# Patient Record
Sex: Male | Born: 1960 | Race: White | Hispanic: No | Marital: Married | Smoking: Never smoker
Health system: Southern US, Community
[De-identification: ages and names within clinical notes are randomized; demographics above are authoritative.]

## PROBLEM LIST (undated history)

## (undated) DIAGNOSIS — I10 Essential (primary) hypertension: Secondary | ICD-10-CM

## (undated) DIAGNOSIS — M549 Dorsalgia, unspecified: Secondary | ICD-10-CM

## (undated) HISTORY — PX: BACK SURGERY: SHX140

---

## 1999-03-12 ENCOUNTER — Emergency Department (HOSPITAL_COMMUNITY): Admission: EM | Admit: 1999-03-12 | Discharge: 1999-03-12 | Payer: Self-pay | Admitting: Emergency Medicine

## 2000-10-22 ENCOUNTER — Encounter: Admission: RE | Admit: 2000-10-22 | Discharge: 2000-10-31 | Payer: Self-pay | Admitting: Specialist

## 2002-06-26 ENCOUNTER — Ambulatory Visit (HOSPITAL_BASED_OUTPATIENT_CLINIC_OR_DEPARTMENT_OTHER): Admission: RE | Admit: 2002-06-26 | Discharge: 2002-06-26 | Payer: Self-pay | Admitting: Surgery

## 2002-06-26 ENCOUNTER — Encounter (INDEPENDENT_AMBULATORY_CARE_PROVIDER_SITE_OTHER): Payer: Self-pay

## 2006-03-28 ENCOUNTER — Encounter: Admission: RE | Admit: 2006-03-28 | Discharge: 2006-03-28 | Payer: Self-pay | Admitting: Orthopedic Surgery

## 2007-10-15 ENCOUNTER — Emergency Department (HOSPITAL_COMMUNITY): Admission: EM | Admit: 2007-10-15 | Discharge: 2007-10-16 | Payer: Self-pay | Admitting: Emergency Medicine

## 2008-03-17 ENCOUNTER — Inpatient Hospital Stay (HOSPITAL_COMMUNITY): Admission: RE | Admit: 2008-03-17 | Discharge: 2008-03-20 | Payer: Self-pay | Admitting: Orthopedic Surgery

## 2008-03-18 ENCOUNTER — Encounter (INDEPENDENT_AMBULATORY_CARE_PROVIDER_SITE_OTHER): Payer: Self-pay | Admitting: Orthopedic Surgery

## 2008-03-18 ENCOUNTER — Ambulatory Visit: Payer: Self-pay | Admitting: Surgery

## 2008-12-30 ENCOUNTER — Inpatient Hospital Stay (HOSPITAL_COMMUNITY): Admission: RE | Admit: 2008-12-30 | Discharge: 2009-01-02 | Payer: Self-pay | Admitting: Orthopedic Surgery

## 2010-10-29 ENCOUNTER — Encounter: Payer: Self-pay | Admitting: Family Medicine

## 2011-01-18 LAB — BASIC METABOLIC PANEL
CO2: 29 mEq/L (ref 19–32)
Calcium: 9.5 mg/dL (ref 8.4–10.5)
Chloride: 102 mEq/L (ref 96–112)
Chloride: 103 mEq/L (ref 96–112)
Creatinine, Ser: 0.94 mg/dL (ref 0.4–1.5)
Creatinine, Ser: 0.97 mg/dL (ref 0.4–1.5)
Creatinine, Ser: 0.99 mg/dL (ref 0.4–1.5)
Creatinine, Ser: 1.06 mg/dL (ref 0.4–1.5)
GFR calc Af Amer: >60 mL/min (ref >60)
GFR calc Af Amer: >60 mL/min (ref >60)
GFR calc non Af Amer: >60 mL/min (ref >60)
Glucose, Bld: 110 mg/dL — ABNORMAL HIGH (ref 70–99)
Glucose, Bld: 96 mg/dL (ref 70–99)
Potassium: 4.3 mEq/L (ref 3.5–5.1)
Potassium: 4.6 mEq/L (ref 3.5–5.1)
Sodium: 138 mEq/L (ref 135–145)

## 2011-01-18 LAB — CBC
HCT: 32 % — ABNORMAL LOW (ref 39.0–52.0)
HCT: 36.3 % — ABNORMAL LOW (ref 39.0–52.0)
HCT: 45.8 % (ref 39.0–52.0)
Hemoglobin: 11.1 g/dL — ABNORMAL LOW (ref 13.0–17.0)
Hemoglobin: 12 g/dL — ABNORMAL LOW (ref 13.0–17.0)
Hemoglobin: 12.6 g/dL — ABNORMAL LOW (ref 13.0–17.0)
MCHC: 34.7 g/dL (ref 30.0–36.0)
MCV: 89 fL (ref 78.0–100.0)
MCV: 89 fL (ref 78.0–100.0)
Platelets: 243 10*3/uL (ref 150–400)
RBC: 3.6 MIL/uL — ABNORMAL LOW (ref 4.22–5.81)
RBC: 4.08 MIL/uL — ABNORMAL LOW (ref 4.22–5.81)
RBC: 5.14 MIL/uL (ref 4.22–5.81)
RDW: 13.4 % (ref 11.5–15.5)
RDW: 13.6 % (ref 11.5–15.5)
WBC: 14.7 10*3/uL — ABNORMAL HIGH (ref 4.0–10.5)

## 2011-01-18 LAB — TYPE AND SCREEN

## 2011-02-20 NOTE — Op Note (Signed)
NAMEROBERTO, ROMANOSKI                 ACCOUNT NO.:  [id number]   MEDICAL RECORD NO.:  [id number]          PATIENT TYPE:  INP   LOCATION:  5029                         FACILITY:  [facility]   PHYSICIAN:  Alvy Beal, MD    DATE OF BIRTH:  Feb 26, 1961   DATE OF PROCEDURE:  DATE OF DISCHARGE:                               OPERATIVE REPORT   PREOPERATIVE DIAGNOSIS:  Degenerative spondylolisthesis, grade 1 at L5-  S1.   POSTOPERATIVE DIAGNOSIS:  Degenerative spondylolisthesis, grade 1 at L5-  S1.   OPERATIVE PROCEDURE:  Anterior lumbar interbody fusion.   COMPLICATIONS:  None.   INSTRUMENTATION SYSTEM USED:  Zimmer STALIF device with 30-mm screw  fixation at L5 and a 25-mm screw fixation at S1, anterior approach.   SURGEON:  Balinda Quails, MD   FIRST ASSISTANT:  Crissie Reese, PA   HISTORY:  This is a very pleasant 50 year old gentleman who presented to  my office with longstanding horrific back pain secondary to a work-  related injury.  Attempts at conservative management consisting of  physiotherapy, injection therapy, and narcotic pain medications had  failed to alleviate his symptoms.  Because of the collapse of L5-S1 disk  and the instability and the failure of conservative management, the  patient elected to proceed with surgery.  All appropriate risks,  benefits, and alternatives of the surgery were discussed with the  patient and consent was obtained.   OPERATIVE NOTE:  The patient was brought to the operating room and  placed supine on the operating table.  After successful induction of  general anesthesia and endotracheal intubation; TEDs, SCDs, and Foley  were applied.  The anterior abdomen was prepped and draped and a log  roll was placed underneath the pelvis to accentuate the extension and to  reduce the slip.   At this point, Dr. Madilyn Fireman scrubbed into the case and performed a standard  anterior approach to the lumbar spine.  Please refer to his dictation  for  specifics on the approach.   A needle was placed at the L5-S1 disk space and we confirmed using  intraoperative AP and lateral fluoroscopy that we were at the L5-S1 disk  space.  Once confirmed, I then scrubbed into the case.   Using a 10-blade scalpel, I incised the annulus of L5-S1 and then using  a combination of pituitary rongeurs, curettes, and Kerrison rongeurs; I  resected the entire disk material.  I then used a lamina spreader to  open the interbody space and then I used fine curettes to release the  annulus from the posterior aspect of the S1 vertebral body and the L5  vertebral body.  At this point with the annulus released, I was able to  get a 30-mm long-handled Kerrison behind the body of L5 and resect the  posterior osteophytes that had developed.  I did a similar procedure  behind the body of S1.  At this point with the diskectomy complete and  the release complete, I confirmed with live fluoro that I had parallel  distraction of the endplates.  Once confirmed, I then  measured the  interbody space.  I was able to get a 17-mm high 12-degree lordotic  STALIF cage to fit appropriately.  The 15-mm high 8-degree lordotic did  have a good fit, but it was not as secure as the 17.  When I measured  his other levels, he had 18-mm disk height at L4-L5, so I elected to  proceed with the 17, in no point that I see fish mouthing of the  interbody space, noted overdistraction at the facet joint and neural  foramen.  At this point with an adequate fit, I obtained the implantable  cage, packed it with Actifuse, and then malleted to appropriate depth.  I then secured it with the 6.5 screws.  I obtained excellent purchase  and maintained my reduction.  I then took final AP and lateral x-rays,  which demonstrated satisfactory hardware position and screw position.  At this point, I then packed the remaining Actifuse along the anterior  aspect of the cage.  With the interbody space, with the  fusion, and  instrumentation complete; I then removed the retractors and irrigated  copiously with normal saline.  There was no advertent bleeding noted.  At this point, I then closed the fascia of the rectus with running #1  Vicryl suture.  I then closed the deep fascia with interrupted 2-0  Vicryl suture in a 2-layer fashion and then the skin with 3-0 Monocryl.  Steri-Strips and dry dressings were applied.  The patient was extubated  and transferred to PACU without incident.  At the end of the case, all  needle and sponge counts were correct and intraoperative digital AP x-  ray after the fascial closure was done demonstrated no retained surgical  devices other than the implant and the 2 screws.  The patient was  transferred to the PACU without incident.  At the end of case, blood  loss was approximately 150 mL, and there were no complications.      Alvy Beal, MD  Electronically Signed     DDB/MEDQ  D:  03/17/2008  T:  03/18/2008  Job:  [id number]   cc:   Balinda Quails, M.D.

## 2011-02-20 NOTE — Op Note (Signed)
[name], [name]                 ACCOUNT NO.:  [id number]   MEDICAL RECORD NO.:  [id number]          PATIENT TYPE:  OIB   LOCATION:  2550                         FACILITY:  [facility]   PHYSICIAN:  Alvy Beal, MD    DATE OF BIRTH:  01-07-1961   DATE OF PROCEDURE:  12/30/2008  DATE OF DISCHARGE:                               OPERATIVE REPORT   SURGEON:  Dahari D. Shon Baton, MD   FIRST ASSISTANT:  Crissie Reese, PA   PREOPERATIVE DIAGNOSIS:  Lumbar final facet pain and L5-S1  pseudoarthrosis.   POSTOPERATIVE DIAGNOSIS:  Lumbar final facet pain and L5-S1  pseudoarthrosis.   OPERATIVE PROCEDURE:  1. Extensive L5-S1 decompression and foraminotomy.  2. Posterior instrumented fusion and arthrodesis at L5-S1.   INSTRUMENTATION USED:  Stryker radius pedicle screw system with a single  cross-link.   COMPLICATIONS:  None.   CONDITION:  Stable.   HISTORY:  This is a very pleasant 50 year old gentleman who  approximately 1 year ago underwent an anterior lumbar interbody fusion  at L5-S1 for degenerative disk disease.  The patient did well initially  but over the time started having increasing back pain.  Most of his pain  was extension related pain.  He did get relief with a facet block.  Subsequent CT evaluation demonstrated a pseudoarthrosis and ongoing  motion at the L5-S1 level.  Because of the nonunion, we elected to  proceed with a posterior supplemental instrumented fusion.  I also  elected to do a decompression to ensure that there was no neuropathic  compression.   OPERATIVE NOTE:  The patient was brought to the operating room and  placed supine on the operating table.  After successful induction of  general anesthesia and endotracheal intubation, TEDs, SCDs, and Foley  were inserted.  The patient was turned prone on the [place] table on the  spine frame.  The arms were properly positioned overhead.  All bony  prominences were well-padded and the back was prepped and  draped in  standard fashion.  A generous posterior midline lumbar incision was made  centered over the L5-S1 disk space.  Sharp dissection was carried out  down to the deep fascia.  Deep fascia was sharply incised to expose from  the inferior aspect of L4 to the  inferior aspect of S1.  These were  spinous processes.   I then used a periosteal Cobb elevators to strip the paraspinal muscles  off the L5-S1 spinous process and lamina and exposed the L4-L5 and L5-S1  facet capsules.  Once I had bilateral exposure, I then continued out  over the lateral aspect.  I dissected on the lateral side of the L4-L5  facet taking care not to disrupt or destroy the L4-L5 facet.  I then  identified the transverse process of L5.  I then disrupted the L5-S1  facet complex decorticating it and stripping out over the sacral ala.  I  did this bilaterally.  At this point in time, I then started with my  decompression.  I then removed the entire S1 spinous process and did a  complete S1 laminectomy.  I then did a small partial L5 laminotomy with  adequate exposure from the superior aspect of the L5 pedicle to the  inferior aspect of the S1 pedicle.  I then carried my dissection out to  the lateral gutter identifying the L5 and S1 nerve roots and performing  a generous L5 foraminotomy and S1 foraminotomy.  I also made sure that  the S1 nerve root was well decompressed in the lateral recess between L5-  S1.  I visualized the anterior cage that was in the disk space and it  was not prominent.  There were some posterior bone fragments that had  pushed posteriorly that I was able to resect.  At this point with the  decompression completed, I proceeded with the instrumentation.   Having direct visualization of the medial border of the pedicle, I was  able to anatomically determine the appropriate starting position for the  L5 pedicle and I confirmed it with lateral fluoroscopy.  I advanced the  trocar through the  pedicle and again checked with fluoroscopy as well as  directly visualizing the medial and inferior border of the L5 pedicle to  ensure that I had not disrupted it.  I then removed the probe, palpated,  tapped, and then checked the pedicle again to ensure there was no  disruption of the pedicle.  I then placed the 45 mm x 6.75 diameter  screws without complication.  I repeated the same procedure on the  contralateral side.  Again since I had direct visualization of the  pedicle, I could confirm that there was no medial or inferior breakout  of the pedicle screw.  At S1, on the right-hand side, using a same  technique of direct palpation and fluoroscopy, I placed the pedicle  screw.  This was a 7.75 x 40 mm length screw.  On the left-hand side,  during the insertion, I did slightly broach the medial wall of the  pedicle but I was able to remove the pedicle probe, reposition, and  place the screw down the S1 pedicle.  At this time, there was no  disruption of the pedicle although there was evidence of the previous  disruption.  I adequately decompressed and removed all of the bone  fragment and ensured that the S1 nerve root was not impinged upon or  directly compressed by the pedicle screw.   At this point in time with the fixation complete, I decorticated the  lateral posterolateral compresses and packed the local bone mixed with  Actifuse in the posterolateral gutter.  I then took two 50-mm rods,  secured them to the pedicle screws, and compressed the construct.  I  then placed a single cross-link for added stability, irrigated the wound  copiously multiple times with normal saline, and placed thrombin-soaked  Gelfoam patty over the entire exposed thecal sac and then placed a deep  drain.  I closed the wound in a layered fashion with interrupted #1  Vicryl sutures, 2-0 Vicryl sutures, and 3-0 Monocryl and I was able to  suture the drain down with a nylon stitch.  The patient was  extubated,  transferred to PACU without incident.  At the end of the case, all  needle and sponge counts were correct.      Alvy Beal, MD  Electronically Signed     DDB/MEDQ  D:  12/30/2008  T:  12/31/2008  Job:  [id number]

## 2011-02-20 NOTE — Op Note (Signed)
NAMEMarland Kitchen  [name] [name], [name] [name] NO.:  [id number]   MEDICAL RECORD NO.:  [id number]          PATIENT TYPE:  INP   LOCATION:  5029                         FACILITY:  [facility]   PHYSICIAN:  Balinda Quails, M.D.    DATE OF BIRTH:  04-Sep-1961   DATE OF PROCEDURE:  DATE OF DISCHARGE:                               OPERATIVE REPORT   SURGEON:  Balinda Quails, M.D.   CLOSE SURGEON:  Alvy Beal, MD.   ANESTHETIC:  General endotracheal.   PREOPERATIVE DIAGNOSIS:  L5-S1 degenerative disk disease.   POSTOPERATIVE DIAGNOSIS:  L5-S1 degenerative disk disease.   PROCEDURE:  L5-S1 anterior lumbar interbody fusion (ALIF).   CLINICAL NOTE:  This 50 year old gentleman is scheduled to undergo L5-S1  ALIF.  He was seen preoperatively and evaluated in the holding area.  No  contraindications to surgery.  Normal lower extremity pulses.  Risks and  benefits of the operative procedure were reviewed with the patient  including but not limited to limb ischemia, DVT, pulmonary embolus,  bleeding, transfusion, ureter injury, sexual dysfunction, hernia, and  infection.   OPERATIVE PROCEDURE:  The patient was brought to the operating room in  stable condition.  Placed under general endotracheal anesthesia.  Foley  catheter arterial line and central venous catheter in place.  Pulse  oximetry placed on the left foot.   The abdomen was prepped and draped in a sterile fashion.  A transverse  skin incision was made in the left lower quadrant at premarked site at  projection of L5-S1.  Subcutaneous tissue divided.  Left anterior rectus  sheath divided from midline to lateral margin.  The rectus muscle  mobilized medially.  The retroperitoneal space entered and the  peritoneal contents rotated anteriorly.  The left common and external  iliac artery were identified.  The psoas muscle and genitofemoral nerve  identified and the nerve preserved on the muscle.   The L5-S1 disk could be easily palpated.   Blunt dissection carried  directly down on the disk.  Middle sacral vessels were ligated.  There  were controlled with Bovie or bipolar cautery and divided.  The L5-S1  disk was cleared from left-to-right with blunt dissection.  The predisk  tissues were scored with Bovie cautery.   At completion of exposure, a Thompson-Brau retractor was assembled and  the reverse lip blades were hooked on the lateral margins of L5-S1  bilaterally.  This allowed full exposure of the disk and malleable  retractors were placed superiorly and inferiorly.   The L5-S1 ALIF was then carried out by Dr. Shon Baton.  Further details of  closure dictated also by Dr. Shon Baton.   The patient had stable pulse oximetry in the left foot throughout the  operative procedure and intact dorsalis pedis pulses bilaterally in the  PACU.      Balinda Quails, M.D.  Electronically Signed     PGH/MEDQ  D:  03/17/2008  T:  03/18/2008  Job:  [id number]

## 2011-02-23 NOTE — Discharge Summary (Signed)
NAMEROBBI, [name] [name]                 ACCOUNT NO.:  [id number]   MEDICAL RECORD NO.:  [id number]          PATIENT TYPE:  OIB   LOCATION:  5004                         FACILITY:  [facility]   PHYSICIAN:  Alvy Beal, MD    DATE OF BIRTH:  [birth year]   DATE OF ADMISSION:  12/30/2008  DATE OF DISCHARGE:  01/02/2009                               DISCHARGE SUMMARY   ADMISSION DIAGNOSIS:  Lumbar facet pain at L5-S1 with pseudoarthrosis at  L5-S1.   DISCHARGE DIAGNOSIS:  Lumbar facet pain at L5-S1 with pseudoarthrosis at  L5-S1.   OPERATIVE PROCEDURE:  Done on December 30, 2008 was:  1. Extensive L5-S1 decompression and foraminotomy.  2. Posterior instrumented fusion and arthrodesis at L5-S1.   CONSULTATIONS:  None.   BRIEF HISTORY:  [name] [name] is a very pleasant 50 year old gentleman who  approximately 1 year ago underwent an anterior lumbar interbody fusion  at L5-S1 for degenerative disk disease with Dr. Shon Baton.  The patient did  well initially but over time he started to have increasing back pain.  Most of his pain was extension related and he did get a relief with  facet block.  Subsequent CT evaluation demonstrated a pseudoarthrosis  and ongoing motion at L5-S1 level.  Because of the nonunion, the patient  elected to proceed with a posterior supplemental instrumented fusion.  Dr. Shon Baton also elected to do a decompression to ensure that there is no  neuropathic compression.   HOSPITAL COURSE:  The patient's hospital course was approximately 3 days  in length.  The patient tolerated the procedure very well and was  transferred from the PACU to the orthopedic floor without incident.  Postoperative day #1, the patient worked well with Physical Therapy.  Radiographs revealed stable placement of the hardware.  He was beginning  to tolerate a regular diet.  He was voiding on his own.  He was having  positive flatus.  Postoperatively on day #2, the patient continued to  improve.  His drain was  pulled and he continued to work well with  Physical Therapy.  Postoperatively on day #3, the patient continued to  make progress with Physical Therapy.  He was now having regular bowel  movements and working well with Physical Therapy.  His labs remained  stable throughout his hospital stay.  Therefore, the patient was deemed  stable to be discharged home with [name].   DISCHARGE LABORATORY DATA:  WBC of 12.9, RBC of 3.9, hemoglobin of 12.0  and a hematocrit of 34.7.  Sodium of 138, potassium of 4.3, chloride of  103, bicarb of 29, glucose of 92, BUN of 19 and creatinine of 1.06.   VITALS AT DISCHARGE:  Temperature of 97.6, pulse of 82 and his blood  pressure was 124/78.   DISPOSITION:  The patient is being discharged to home with [name] [name].   DISCHARGE MEDICATIONS:  The patient's discharge medications include:  1. Lisinopril 10 mg daily.  2. He is being discharged home on Robaxin 500 mg 1 tablet p.o. q.8 h.      p.r.n.  pain.  3. Norco 10/325 one tablet p.o. q.6 h. p.r.n. pain.  4. The patient is instructed to stop his hydrocodone 5/325.  5. Stop his cyclobenzaprine at 10 mg.   DISCHARGE INSTRUCTIONS:  The patient is instructed to avoid any bending,  stooping, twisting, and/or squatting.  He cannot walk as much as  tolerated.  He is to avoid any other rigorous activity.  He is not to  drive for 4-6 weeks.  The patient is to avoid lifting anything over 8  pounds.  The patient is allowed to shower postoperatively on day #5.  He  is to change the dressing daily x7 days and he is instructed to call our  office and schedule his followup appointment in 2 weeks.  The patient is  instructed to call the office for any increased fever, pain, redness  around the incision site and/or new onset of radicular leg pain, any  loss of bowel or bladder function.  Again, the patient is  instructed to  schedule his followup appointment at our office in 2 weeks and at  that  point, we will do a wound check.      Crissie Reese, PA      Alvy Beal, MD  Electronically Signed    AC/MEDQ  D:  02/11/2009  T:  02/12/2009  Job:  [id number]

## 2011-02-23 NOTE — Discharge Summary (Signed)
[name] [name], [name] [name]                 ACCOUNT NO.:  [id number]   MEDICAL RECORD NO.:  [id number]          PATIENT TYPE:  INP   LOCATION:  5029                         FACILITY:  [facility]   PHYSICIAN:  Alvy Beal, MD    DATE OF BIRTH:  12-May-1961   DATE OF ADMISSION:  03/17/2008  DATE OF DISCHARGE:  03/20/2008                               DISCHARGE SUMMARY   ADMITTING DIAGNOSIS:  Lumbar degenerative disk disease with grade 1  spondylolisthesis, L5-S1.   DISCHARGE DIAGNOSIS:  Lumbar degenerative disk disease with grade 1  spondylolisthesis, L5-S1.   Operative procedure done on date of admission was an anterior lumbar  interbody fusion, L5-S1 without complication.   HISTORY:  [name] is a very pleasant 50 year old gentleman with chronic  severe debilitating low back pain.  Attempts at conservative management  had failed to alleviate his symptoms.  We tried pain medical management,  injection therapy, physical therapy, and narcotic medications.  With  ongoing debilitating pain, I discussed treatment options with him and he  elected to proceed with surgery.  All appropriate risks, benefits, and  alternatives to surgery were discussed and consent was obtained.   On the date of admission, the patient was brought to the operating room  and had an anterior lumbar interbody fusion with fixation.  This was  uneventful with no significant adverse intraoperative complications.   On postoperative day #1, the patient had a lower extremity venous  Doppler, which showed no evidence of DVT.  He was seen and evaluated by  physical therapy and occupational therapy and was noted to be stable.   The patient's incision was clean, dry, and intact.  His constipation  resolved and he had a bowel movement on postoperative day #2.  He was  seen by Dr. Madilyn Fireman and was noted to be stable.   The patient was cleared by physical therapy and then later discharged on  March 20, 2008.  He was sent home with  appropriate pain medications,  muscle relaxers, and restarted his home medications.  The patient was  neurovascularly intact.  Postoperatively, a CT scan was done, which  demonstrated satisfactorily placement of the hardware, reduction of the  L5-S1 slip.      Alvy Beal, MD  Electronically Signed     DDB/MEDQ  D:  08/04/2008  T:  08/05/2008  Job:  [id number]

## 2011-06-28 LAB — CBC
HCT: 42.2
Hemoglobin: 14.1
MCHC: 33.4
MCV: 87.4
Platelets: 264
RBC: 4.83
RDW: 13.3
WBC: 10.8 — ABNORMAL HIGH

## 2011-06-28 LAB — DIFFERENTIAL
Basophils Absolute: 0
Basophils Relative: 0
Eosinophils Absolute: 0.2
Eosinophils Relative: 2
Lymphocytes Relative: 15
Lymphs Abs: 1.7
Monocytes Absolute: 1
Monocytes Relative: 9
Neutro Abs: 7.9 — ABNORMAL HIGH
Neutrophils Relative %: 74

## 2011-06-28 LAB — BASIC METABOLIC PANEL
BUN: 16
Chloride: 106
GFR calc Af Amer: >60
GFR calc non Af Amer: >60
Potassium: 3.8
Sodium: 138

## 2011-07-05 LAB — CBC
HCT: 45.1
Hemoglobin: 16
MCV: 88.4
RBC: 5.09
WBC: 9.2

## 2011-07-05 LAB — BASIC METABOLIC PANEL
CO2: 27
Chloride: 105
Creatinine, Ser: 0.98
GFR calc Af Amer: >60
Glucose, Bld: 103 — ABNORMAL HIGH

## 2011-07-05 LAB — TYPE AND SCREEN

## 2011-07-05 LAB — ABO/RH: ABO/RH(D): O NEG

## 2015-02-19 ENCOUNTER — Encounter (HOSPITAL_COMMUNITY): Payer: Self-pay | Admitting: Emergency Medicine

## 2015-02-19 ENCOUNTER — Emergency Department (HOSPITAL_COMMUNITY)
Admission: EM | Admit: 2015-02-19 | Discharge: 2015-02-19 | Disposition: A | Discharge status: Home / Self Care | Payer: Medicare Other | Attending: Emergency Medicine | Admitting: Emergency Medicine

## 2015-02-19 DIAGNOSIS — R05 Cough: Secondary | ICD-10-CM | POA: Diagnosis present

## 2015-02-19 DIAGNOSIS — J069 Acute upper respiratory infection, unspecified: Secondary | ICD-10-CM | POA: Diagnosis not present

## 2015-02-19 DIAGNOSIS — I10 Essential (primary) hypertension: Secondary | ICD-10-CM | POA: Insufficient documentation

## 2015-02-19 HISTORY — DX: Essential (primary) hypertension: I10

## 2015-02-19 HISTORY — DX: Dorsalgia, unspecified: M54.9

## 2015-02-19 NOTE — ED Notes (Signed)
Patient complaining of cough, body aches, fever, headache, one episode of diarrhea today.

## 2015-02-19 NOTE — Discharge Instructions (Signed)
Get plenty of rest and drink a lot of fluids. Take Tylenol or Motrin as needed for fever. See the doctor of your choice if not better in 2 or 3 days.   Upper Respiratory Infection, Adult An upper respiratory infection (URI) is also sometimes known as the common cold. The upper respiratory tract includes the nose, sinuses, throat, trachea, and bronchi. Bronchi are the airways leading to the lungs. Most people improve within 1 week, but symptoms can last up to 2 weeks. A residual cough may last even longer.  CAUSES Many different viruses can infect the tissues lining the upper respiratory tract. The tissues become irritated and inflamed and often become very moist. Mucus production is also common. A cold is contagious. You can easily spread the virus to others by oral contact. This includes kissing, sharing a glass, coughing, or sneezing. Touching your mouth or nose and then touching a surface, which is then touched by another person, can also spread the virus. SYMPTOMS  Symptoms typically develop 1 to 3 days after you come in contact with a cold virus. Symptoms vary from person to person. They may include:  Runny nose.  Sneezing.  Nasal congestion.  Sinus irritation.  Sore throat.  Loss of voice (laryngitis).  Cough.  Fatigue.  Muscle aches.  Loss of appetite.  Headache.  Low-grade fever. DIAGNOSIS  You might diagnose your own cold based on familiar symptoms, since most people get a cold 2 to 3 times a year. Your caregiver can confirm this based on your exam. Most importantly, your caregiver can check that your symptoms are not due to another disease such as strep throat, sinusitis, pneumonia, asthma, or epiglottitis. Blood tests, throat tests, and X-rays are not necessary to diagnose a common cold, but they may sometimes be helpful in excluding other more serious diseases. Your caregiver will decide if any further tests are required. RISKS AND COMPLICATIONS  You may be at risk  for a more severe case of the common cold if you smoke cigarettes, have chronic heart disease (such as heart failure) or lung disease (such as asthma), or if you have a weakened immune system. The very young and very old are also at risk for more serious infections. Bacterial sinusitis, middle ear infections, and bacterial pneumonia can complicate the common cold. The common cold can worsen asthma and chronic obstructive pulmonary disease (COPD). Sometimes, these complications can require emergency medical care and may be life-threatening. PREVENTION  The best way to protect against getting a cold is to practice good hygiene. Avoid oral or hand contact with people with cold symptoms. Wash your hands often if contact occurs. There is no clear evidence that vitamin C, vitamin E, echinacea, or exercise reduces the chance of developing a cold. However, it is always recommended to get plenty of rest and practice good nutrition. TREATMENT  Treatment is directed at relieving symptoms. There is no cure. Antibiotics are not effective, because the infection is caused by a virus, not by bacteria. Treatment may include:  Increased fluid intake. Sports drinks offer valuable electrolytes, sugars, and fluids.  Breathing heated mist or steam (vaporizer or shower).  Eating chicken soup or other clear broths, and maintaining good nutrition.  Getting plenty of rest.  Using gargles or lozenges for comfort.  Controlling fevers with ibuprofen or acetaminophen as directed by your caregiver.  Increasing usage of your inhaler if you have asthma. Zinc gel and zinc lozenges, taken in the first 24 hours of the common cold, can shorten  the duration and lessen the severity of symptoms. Pain medicines may help with fever, muscle aches, and throat pain. A variety of non-prescription medicines are available to treat congestion and runny nose. Your caregiver can make recommendations and may suggest nasal or lung inhalers for other  symptoms.  HOME CARE INSTRUCTIONS   Only take over-the-counter or prescription medicines for pain, discomfort, or fever as directed by your caregiver.  Use a warm mist humidifier or inhale steam from a shower to increase air moisture. This may keep secretions moist and make it easier to breathe.  Drink enough water and fluids to keep your urine clear or pale yellow.  Rest as needed.  Return to work when your temperature has returned to normal or as your caregiver advises. You may need to stay home longer to avoid infecting others. You can also use a face mask and careful hand washing to prevent spread of the virus. SEEK MEDICAL CARE IF:   After the first few days, you feel you are getting worse rather than better.  You need your caregiver's advice about medicines to control symptoms.  You develop chills, worsening shortness of breath, or brown or red sputum. These may be signs of pneumonia.  You develop yellow or brown nasal discharge or pain in the face, especially when you bend forward. These may be signs of sinusitis.  You develop a fever, swollen neck glands, pain with swallowing, or white areas in the back of your throat. These may be signs of strep throat. SEEK IMMEDIATE MEDICAL CARE IF:   You have a fever.  You develop severe or persistent headache, ear pain, sinus pain, or chest pain.  You develop wheezing, a prolonged cough, cough up blood, or have a change in your usual mucus (if you have chronic lung disease).  You develop sore muscles or a stiff neck. Document Released: 03/20/2001 Document Revised: 12/17/2011 Document Reviewed: 12/30/2013 [facility] Patient Information 2015 [employer] [place]. This information is not intended to replace advice given to you by your health care provider. Make sure you discuss any questions you have with your health care provider.

## 2015-02-19 NOTE — ED Provider Notes (Signed)
CSN: [id number]     Arrival date & time 02/19/15  2021 History   First MD Initiated Contact with Patient 02/19/15 2055     Chief Complaint  Patient presents with  . Cough     (Consider location/radiation/quality/duration/timing/severity/associated sxs/prior Treatment) HPI   Rosita KeaDonnie Peretz is a 54 y.o. male who presents for evaluation of cough, fever, body aches, and a single episode of diarrhea. He is using Tylenol with improvement of his fever. His cough is improving after taking Robitussin. He denies nausea, vomiting, weakness or dizziness. His son had a similar illness about 3 days ago. He has been taking his usual medications. There are no other known modifying factors.   Past Medical History  Diagnosis Date  . Hypertension   . Back pain    Past Surgical History  Procedure Laterality Date  . Back surgery     History reviewed. No pertinent family history. History  Substance Use Topics  . Smoking status: Never Smoker   . Smokeless tobacco: Not on file  . Alcohol Use: No    Review of Systems  All other systems reviewed and are negative.     Allergies  Tamiflu  Home Medications   Prior to Admission medications   Not on File   BP 122/74 mmHg  Pulse 92  Temp(Src) 100.4 F (38 C) (Oral)  Ht 5' 8.5" (1.74 m)  Wt 270 lb (122.471 kg)  BMI 40.45 kg/m2  SpO2 94% Physical Exam  Constitutional: He is oriented to person, place, and time. He appears well-developed and well-nourished.  HENT:  Head: Normocephalic and atraumatic.  Right Ear: External ear normal.  Left Ear: External ear normal.  No tenderness to percussion over the frontal or maxillary sinuses.  Eyes: Conjunctivae and EOM are normal. Pupils are equal, round, and reactive to light.  Neck: Normal range of motion and phonation normal. Neck supple.  Cardiovascular: Normal rate, regular rhythm and normal heart sounds.   Pulmonary/Chest: Effort normal and breath sounds normal. No respiratory distress. He has no  wheezes. He exhibits no tenderness and no bony tenderness.  Abdominal: Soft. There is no tenderness.  Musculoskeletal: Normal range of motion.  Neurological: He is alert and oriented to person, place, and time. No cranial nerve deficit or sensory deficit. He exhibits normal muscle tone. Coordination normal.  Skin: Skin is warm, dry and intact. No rash noted.  Psychiatric: He has a normal mood and affect. His behavior is normal. Judgment and thought content normal.  Nursing note and vitals reviewed.   ED Course  Procedures (including critical care time)  Nontoxic, patient with essentially normal neurologic exam. Findings discussed with patient. Questions answered  Labs Review Labs Reviewed - No data to display  Imaging Review No results found.   EKG Interpretation None      MDM   Final diagnoses:  URI (upper respiratory infection)    Evaluation consistent with URI. Doubt pneumonia, metabolic instability or serious bacterial infection.  Nursing Notes Reviewed/ Care Coordinated Applicable Imaging Reviewed Interpretation of Laboratory Data incorporated into ED treatment  The patient appears reasonably screened and/or stabilized for discharge and I doubt any other medical condition or other [facility] requiring further screening, evaluation, or treatment in the ED at this time prior to discharge.  Plan: Home Medications- OTC antipyretic, Robitussin DM for cough; Home Treatments- rest, fluids; return here if the recommended treatment, does not improve the symptoms; Recommended follow up- PCP prn     Mancel BaleElliott Sincere Liuzzi, MD 02/19/15 2109

## 2015-02-23 ENCOUNTER — Inpatient Hospital Stay (HOSPITAL_COMMUNITY)
Admission: EM | Admit: 2015-02-23 | Discharge: 2015-02-28 | DRG: 871 | Disposition: A | Discharge status: Home / Self Care | Payer: Medicare Other | Attending: Internal Medicine | Admitting: Internal Medicine

## 2015-02-23 ENCOUNTER — Encounter (HOSPITAL_COMMUNITY): Payer: Self-pay | Admitting: Emergency Medicine

## 2015-02-23 ENCOUNTER — Emergency Department (HOSPITAL_COMMUNITY): Payer: Medicare Other

## 2015-02-23 ENCOUNTER — Inpatient Hospital Stay (HOSPITAL_COMMUNITY): Payer: Medicare Other

## 2015-02-23 DIAGNOSIS — E876 Hypokalemia: Secondary | ICD-10-CM | POA: Diagnosis present

## 2015-02-23 DIAGNOSIS — Z791 Long term (current) use of non-steroidal anti-inflammatories (NSAID): Secondary | ICD-10-CM | POA: Diagnosis not present

## 2015-02-23 DIAGNOSIS — E860 Dehydration: Secondary | ICD-10-CM | POA: Diagnosis present

## 2015-02-23 DIAGNOSIS — J9601 Acute respiratory failure with hypoxia: Secondary | ICD-10-CM | POA: Diagnosis present

## 2015-02-23 DIAGNOSIS — I350 Nonrheumatic aortic (valve) stenosis: Secondary | ICD-10-CM | POA: Diagnosis not present

## 2015-02-23 DIAGNOSIS — Z79899 Other long term (current) drug therapy: Secondary | ICD-10-CM | POA: Diagnosis not present

## 2015-02-23 DIAGNOSIS — A419 Sepsis, unspecified organism: Secondary | ICD-10-CM | POA: Diagnosis present

## 2015-02-23 DIAGNOSIS — Z8249 Family history of ischemic heart disease and other diseases of the circulatory system: Secondary | ICD-10-CM

## 2015-02-23 DIAGNOSIS — Z888 Allergy status to other drugs, medicaments and biological substances status: Secondary | ICD-10-CM | POA: Diagnosis not present

## 2015-02-23 DIAGNOSIS — J9600 Acute respiratory failure, unspecified whether with hypoxia or hypercapnia: Secondary | ICD-10-CM | POA: Diagnosis present

## 2015-02-23 DIAGNOSIS — J189 Pneumonia, unspecified organism: Secondary | ICD-10-CM | POA: Diagnosis present

## 2015-02-23 DIAGNOSIS — I10 Essential (primary) hypertension: Secondary | ICD-10-CM | POA: Diagnosis present

## 2015-02-23 DIAGNOSIS — K760 Fatty (change of) liver, not elsewhere classified: Secondary | ICD-10-CM | POA: Diagnosis present

## 2015-02-23 DIAGNOSIS — R112 Nausea with vomiting, unspecified: Secondary | ICD-10-CM | POA: Diagnosis present

## 2015-02-23 DIAGNOSIS — K7200 Acute and subacute hepatic failure without coma: Secondary | ICD-10-CM | POA: Diagnosis present

## 2015-02-23 DIAGNOSIS — R7989 Other specified abnormal findings of blood chemistry: Secondary | ICD-10-CM | POA: Diagnosis not present

## 2015-02-23 DIAGNOSIS — E871 Hypo-osmolality and hyponatremia: Secondary | ICD-10-CM | POA: Diagnosis present

## 2015-02-23 DIAGNOSIS — R609 Edema, unspecified: Secondary | ICD-10-CM

## 2015-02-23 DIAGNOSIS — M79609 Pain in unspecified limb: Secondary | ICD-10-CM | POA: Diagnosis not present

## 2015-02-23 DIAGNOSIS — R945 Abnormal results of liver function studies: Secondary | ICD-10-CM

## 2015-02-23 LAB — CBC
HCT: 38.1 % — ABNORMAL LOW (ref 39.0–52.0)
Hemoglobin: 13.4 g/dL (ref 13.0–17.0)
MCH: 30 pg (ref 26.0–34.0)
MCHC: 35.2 g/dL (ref 30.0–36.0)
MCV: 85.2 fL (ref 78.0–100.0)
PLATELETS: 248 10*3/uL (ref 150–400)
RBC: 4.47 MIL/uL (ref 4.22–5.81)
RDW: 13.3 % (ref 11.5–15.5)
WBC: 19 10*3/uL — ABNORMAL HIGH (ref 4.0–10.5)

## 2015-02-23 LAB — COMPREHENSIVE METABOLIC PANEL
ALT: 82 U/L — ABNORMAL HIGH (ref 17–63)
ANION GAP: 14 (ref 5–15)
AST: 116 U/L — AB (ref 15–41)
Albumin: 3 g/dL — ABNORMAL LOW (ref 3.5–5.0)
Alkaline Phosphatase: 76 U/L (ref 38–126)
BILIRUBIN TOTAL: 1.3 mg/dL — AB (ref 0.3–1.2)
BUN: 14 mg/dL (ref 6–20)
CALCIUM: 8.8 mg/dL — AB (ref 8.9–10.3)
CO2: 24 mmol/L (ref 22–32)
CREATININE: 1.17 mg/dL (ref 0.61–1.24)
Chloride: 90 mmol/L — ABNORMAL LOW (ref 101–111)
GFR calc Af Amer: >60 mL/min (ref >60)
GLUCOSE: 128 mg/dL — AB (ref 65–99)
Potassium: 3.3 mmol/L — ABNORMAL LOW (ref 3.5–5.1)
Sodium: 128 mmol/L — ABNORMAL LOW (ref 135–145)
Total Protein: 7.7 g/dL (ref 6.5–8.1)

## 2015-02-23 LAB — CG4 I-STAT (LACTIC ACID): LACTIC ACID, VENOUS: 1.23 mmol/L (ref 0.5–2.0)

## 2015-02-23 LAB — CBC WITH DIFFERENTIAL/PLATELET
BASOS PCT: 0 % (ref 0–1)
Basophils Absolute: 0 10*3/uL (ref 0.0–0.1)
EOS ABS: 0.1 10*3/uL (ref 0.0–0.7)
EOS PCT: 0 % (ref 0–5)
HCT: 42.4 % (ref 39.0–52.0)
HEMOGLOBIN: 15.2 g/dL (ref 13.0–17.0)
LYMPHS ABS: 1.4 10*3/uL (ref 0.7–4.0)
LYMPHS PCT: 8 % — AB (ref 12–46)
MCH: 30.3 pg (ref 26.0–34.0)
MCHC: 35.8 g/dL (ref 30.0–36.0)
MCV: 84.6 fL (ref 78.0–100.0)
MONOS PCT: 6 % (ref 3–12)
Monocytes Absolute: 1.2 10*3/uL — ABNORMAL HIGH (ref 0.1–1.0)
NEUTROS ABS: 16.6 10*3/uL — AB (ref 1.7–7.7)
NEUTROS PCT: 86 % — AB (ref 43–77)
Platelets: 264 10*3/uL (ref 150–400)
RBC: 5.01 MIL/uL (ref 4.22–5.81)
RDW: 13.4 % (ref 11.5–15.5)
WBC: 19.3 10*3/uL — AB (ref 4.0–10.5)

## 2015-02-23 LAB — I-STAT CG4 LACTIC ACID, ED
LACTIC ACID, VENOUS: 1.74 mmol/L (ref 0.5–2.0)
Lactic Acid, Venous: 1.8 mmol/L (ref 0.5–2.0)

## 2015-02-23 LAB — BRAIN NATRIURETIC PEPTIDE: B Natriuretic Peptide: 47.9 pg/mL (ref 0.0–100.0)

## 2015-02-23 LAB — I-STAT TROPONIN, ED: Troponin i, poc: 0.02 ng/mL (ref 0.00–0.08)

## 2015-02-23 MED ORDER — ACETAMINOPHEN 325 MG PO TABS
650.0000 mg | ORAL_TABLET | Freq: Four times a day (QID) | ORAL | Status: DC | PRN
  Administered 2015-02-23: 650 mg via ORAL
  Filled 2015-02-23: qty 2

## 2015-02-23 MED ORDER — DEXTROSE 5 % IV SOLN
1.0000 g | Freq: Once | INTRAVENOUS | Status: AC
  Administered 2015-02-23: 1 g via INTRAVENOUS
  Filled 2015-02-23: qty 10

## 2015-02-23 MED ORDER — ONDANSETRON HCL 4 MG PO TABS: 4.0000 mg | ORAL_TABLET | Freq: Four times a day (QID) | ORAL | Status: DC | PRN

## 2015-02-23 MED ORDER — CEFTRIAXONE SODIUM IN DEXTROSE 20 MG/ML IV SOLN
1.0000 g | Freq: Once | INTRAVENOUS | Status: DC
  Filled 2015-02-23: qty 50

## 2015-02-23 MED ORDER — ONDANSETRON HCL 4 MG/2ML IJ SOLN
4.0000 mg | Freq: Four times a day (QID) | INTRAMUSCULAR | Status: DC | PRN
  Administered 2015-02-23: 4 mg via INTRAVENOUS
  Filled 2015-02-23: qty 2

## 2015-02-23 MED ORDER — DEXTROSE 5 % IV SOLN
500.0000 mg | Freq: Once | INTRAVENOUS | Status: DC
  Filled 2015-02-23: qty 500

## 2015-02-23 MED ORDER — POTASSIUM CHLORIDE CRYS ER 20 MEQ PO TBCR
40.0000 meq | EXTENDED_RELEASE_TABLET | Freq: Once | ORAL | Status: AC
  Administered 2015-02-23: 40 meq via ORAL
  Filled 2015-02-23: qty 2

## 2015-02-23 MED ORDER — DEXTROSE 5 % IV SOLN
1.0000 g | INTRAVENOUS | Status: DC
  Administered 2015-02-24 – 2015-02-26 (×3): 1 g via INTRAVENOUS
  Filled 2015-02-23 (×5): qty 10

## 2015-02-23 MED ORDER — BENZONATATE 100 MG PO CAPS
100.0000 mg | ORAL_CAPSULE | Freq: Three times a day (TID) | ORAL | Status: DC | PRN
  Administered 2015-02-24: 200 mg via ORAL
  Filled 2015-02-23 (×2): qty 2

## 2015-02-23 MED ORDER — FLUOXETINE HCL 20 MG PO CAPS
20.0000 mg | ORAL_CAPSULE | Freq: Every day | ORAL | Status: DC
  Administered 2015-02-24 – 2015-02-28 (×5): 20 mg via ORAL
  Filled 2015-02-23 (×5): qty 1

## 2015-02-23 MED ORDER — SODIUM CHLORIDE 0.9 % IV BOLUS (SEPSIS)
1000.0000 mL | INTRAVENOUS | Status: DC
  Administered 2015-02-23: 1000 mL via INTRAVENOUS

## 2015-02-23 MED ORDER — DEXTROSE 5 % IV SOLN
500.0000 mg | Freq: Once | INTRAVENOUS | Status: AC
  Administered 2015-02-23: 500 mg via INTRAVENOUS
  Filled 2015-02-23 (×2): qty 500

## 2015-02-23 MED ORDER — GUAIFENESIN ER 600 MG PO TB12
600.0000 mg | ORAL_TABLET | Freq: Two times a day (BID) | ORAL | Status: DC | PRN
  Administered 2015-02-24: 600 mg via ORAL
  Filled 2015-02-23 (×2): qty 1

## 2015-02-23 MED ORDER — ENOXAPARIN SODIUM 40 MG/0.4ML ~~LOC~~ SOLN
40.0000 mg | SUBCUTANEOUS | Status: DC
  Administered 2015-02-24 – 2015-02-27 (×5): 40 mg via SUBCUTANEOUS
  Filled 2015-02-23 (×6): qty 0.4

## 2015-02-23 MED ORDER — LISINOPRIL 10 MG PO TABS
10.0000 mg | ORAL_TABLET | Freq: Every day | ORAL | Status: DC
  Administered 2015-02-24 – 2015-02-28 (×5): 10 mg via ORAL
  Filled 2015-02-23 (×5): qty 1

## 2015-02-23 MED ORDER — IPRATROPIUM-ALBUTEROL 0.5-2.5 (3) MG/3ML IN SOLN
3.0000 mL | Freq: Once | RESPIRATORY_TRACT | Status: AC
  Administered 2015-02-23: 3 mL via RESPIRATORY_TRACT
  Filled 2015-02-23: qty 3

## 2015-02-23 MED ORDER — SODIUM CHLORIDE 0.9 % IV SOLN
INTRAVENOUS | Status: AC
  Administered 2015-02-23: 23:00:00 via INTRAVENOUS

## 2015-02-23 MED ORDER — LORAZEPAM 0.5 MG PO TABS
0.5000 mg | ORAL_TABLET | ORAL | Status: DC | PRN
  Administered 2015-02-25 – 2015-02-26 (×3): 0.5 mg via ORAL
  Filled 2015-02-23 (×3): qty 1

## 2015-02-23 MED ORDER — SIMVASTATIN 20 MG PO TABS
20.0000 mg | ORAL_TABLET | Freq: Every day | ORAL | Status: DC
  Administered 2015-02-24 – 2015-02-28 (×5): 20 mg via ORAL
  Filled 2015-02-23 (×5): qty 1

## 2015-02-23 MED ORDER — DEXTROSE 5 % IV SOLN: 250.0000 mg | INTRAVENOUS | Status: DC

## 2015-02-23 MED ORDER — ACETAMINOPHEN 650 MG RE SUPP: 650.0000 mg | Freq: Four times a day (QID) | RECTAL | Status: DC | PRN

## 2015-02-23 NOTE — ED Notes (Signed)
Pt saturations 86% on room air. Pt placed on 2.5L and o2 saturations 92%

## 2015-02-23 NOTE — ED Provider Notes (Addendum)
CSN: [id number]     Arrival date & time 02/23/15  1848 History   First MD Initiated Contact with Patient 02/23/15 1926     Chief Complaint  Patient presents with  . Shortness of Breath  . Fever     (Consider location/radiation/quality/duration/timing/severity/associated sxs/prior Treatment) HPI Complains of cough productive of yellow sputum onset 4 days ago. Symptoms are coming by fever. He denies any vomiting. No shortness of breath. No other associated symptoms.. Treated with amoxicillin starting 2 days ago without relief. Denies any calf pain. Seen by Dr.Burnette earlier today. Sent here for further evaluation. Dr. Doristine Counter was concerned about possible pulmonary embolism. Past Medical History  Diagnosis Date  . Hypertension   . Back pain    Past Surgical History  Procedure Laterality Date  . Back surgery     History reviewed. No pertinent family history. History  Substance Use Topics  . Smoking status: Never Smoker   . Smokeless tobacco: Not on file  . Alcohol Use: No   no illicit drug use  Review of Systems  Constitutional: Positive for fever.  HENT: Negative.   Respiratory: Positive for cough and shortness of breath.   Cardiovascular: Negative.   Gastrointestinal: Negative.   Musculoskeletal: Negative.   Skin: Negative.   Neurological: Negative.   Psychiatric/Behavioral: Negative.   All other systems reviewed and are negative.     Allergies  Tamiflu  Home Medications   Prior to Admission medications   Not on File   BP 126/70 mmHg  Pulse 88  Temp(Src) 101.8 F (38.8 C) (Oral)  Resp 26  SpO2 92% Physical Exam  Constitutional: He appears well-developed and well-nourished. No distress.  HENT:  Head: Normocephalic and atraumatic.  Eyes: Conjunctivae are normal. Pupils are equal, round, and reactive to light.  Neck: Neck supple. No tracheal deviation present. No thyromegaly present.  Cardiovascular: Normal rate and regular rhythm.   No murmur  heard. Pulmonary/Chest: Effort normal. No respiratory distress.  Coughing frequently  Abdominal: Soft. Bowel sounds are normal. He exhibits no distension. There is no tenderness.  Musculoskeletal: Normal range of motion. He exhibits no edema or tenderness.  Neurological: He is alert. Coordination normal.  Skin: Skin is warm and dry. No rash noted.  Psychiatric: He has a normal mood and affect.  Nursing note and vitals reviewed.   ED Course  Procedures (including critical care time) Labs Review Labs Reviewed  CBC WITH DIFFERENTIAL/PLATELET - Abnormal; Notable for the following:    WBC 19.3 (*)    Neutrophils Relative % 86 (*)    Neutro Abs 16.6 (*)    Lymphocytes Relative 8 (*)    Monocytes Absolute 1.2 (*)    All other components within normal limits  COMPREHENSIVE METABOLIC PANEL - Abnormal; Notable for the following:    Sodium 128 (*)    Potassium 3.3 (*)    Chloride 90 (*)    Glucose, Bld 128 (*)    Calcium 8.8 (*)    Albumin 3.0 (*)    AST 116 (*)    ALT 82 (*)    Total Bilirubin 1.3 (*)    All other components within normal limits  CULTURE, BLOOD (ROUTINE X 2)  CULTURE, BLOOD (ROUTINE X 2)  BRAIN NATRIURETIC PEPTIDE  I-STAT TROPOININ, ED  I-STAT CG4 LACTIC ACID, ED  I-STAT CG4 LACTIC ACID, ED    Imaging Review Dg Chest 2 View  02/23/2015   CLINICAL DATA:  Chest pain, shortness of breath, cough and fever since 02/20/2015.  EXAM: CHEST  2 VIEW  COMPARISON:  PA and lateral chest 12/24/2008.  FINDINGS: Extensive airspace disease is seen the left chest, worst in the lingula. The right lung appears clear. Heart size is normal. No pneumothorax or pleural effusion.  IMPRESSION: Extensive left side airspace disease most consistent with pneumonia. Recommend followup films to clearing.   Electronically Signed   By: Drusilla Kannerhomas  Dalessio M.D.   On: 02/23/2015 19:44     EKG Interpretation   Date/Time:  Wednesday Feb 23 2015 18:56:20 EDT Ventricular Rate:  90 PR Interval:   124 QRS Duration: 88 QT Interval:  360 QTC Calculation: 440 R Axis:   -39 Text Interpretation:  Normal sinus rhythm Left axis deviation Abnormal ECG  No significant change since last tracing Confirmed by Channon Brougher  MD, Jayten Gabbard  [phone]([id number]) on 02/23/2015 8:36:51 PM     chest x-ray viewed by me 835 pmPatient states breathing improved slightly after treatment with DuoNeb. Results for orders placed or performed during the hospital encounter of 02/23/15  BNP (order ONLY if patient complains of dyspnea/SOB AND you have documented it for THIS visit)  Result Value Ref Range   B Natriuretic Peptide 47.9 0.0 - 100.0 pg/mL  CBC with Differential  Result Value Ref Range   WBC 19.3 (H) 4.0 - 10.5 K/uL   RBC 5.01 4.22 - 5.81 MIL/uL   Hemoglobin 15.2 13.0 - 17.0 g/dL   HCT 78.442.4 69.639.0 - 29.552.0 %   MCV 84.6 78.0 - 100.0 fL   MCH 30.3 26.0 - 34.0 pg   MCHC 35.8 30.0 - 36.0 g/dL   RDW 28.413.4 13.211.5 - 44.015.5 %   Platelets 264 150 - 400 K/uL   Neutrophils Relative % 86 (H) 43 - 77 %   Neutro Abs 16.6 (H) 1.7 - 7.7 K/uL   Lymphocytes Relative 8 (L) 12 - 46 %   Lymphs Abs 1.4 0.7 - 4.0 K/uL   Monocytes Relative 6 3 - 12 %   Monocytes Absolute 1.2 (H) 0.1 - 1.0 K/uL   Eosinophils Relative 0 0 - 5 %   Eosinophils Absolute 0.1 0.0 - 0.7 K/uL   Basophils Relative 0 0 - 1 %   Basophils Absolute 0.0 0.0 - 0.1 K/uL  Comprehensive metabolic panel  Result Value Ref Range   Sodium 128 (L) 135 - 145 mmol/L   Potassium 3.3 (L) 3.5 - 5.1 mmol/L   Chloride 90 (L) 101 - 111 mmol/L   CO2 24 22 - 32 mmol/L   Glucose, Bld 128 (H) 65 - 99 mg/dL   BUN 14 6 - 20 mg/dL   Creatinine, Ser 1.021.17 0.61 - 1.24 mg/dL   Calcium 8.8 (L) 8.9 - 10.3 mg/dL   Total Protein 7.7 6.5 - 8.1 g/dL   Albumin 3.0 (L) 3.5 - 5.0 g/dL   AST 725116 (H) 15 - 41 U/L   ALT 82 (H) 17 - 63 U/L   Alkaline Phosphatase 76 38 - 126 U/L   Total Bilirubin 1.3 (H) 0.3 - 1.2 mg/dL   GFR calc non Af Amer >60 >60 mL/min   GFR calc Af Amer >60 >60 mL/min   Anion gap  14 5 - 15  I-stat troponin, ED  [facility])  Result Value Ref Range   Troponin i, poc 0.02 0.00 - 0.08 ng/mL   Comment 3          I-Stat CG4 Lactic Acid, ED  Result Value Ref Range   Lactic Acid, Venous 1.80 0.5 -  2.0 mmol/L   Dg Chest 2 View  02/23/2015   CLINICAL DATA:  Chest pain, shortness of breath, cough and fever since 02/20/2015.  EXAM: CHEST  2 VIEW  COMPARISON:  PA and lateral chest 12/24/2008.  FINDINGS: Extensive airspace disease is seen the left chest, worst in the lingula. The right lung appears clear. Heart size is normal. No pneumothorax or pleural effusion.  IMPRESSION: Extensive left side airspace disease most consistent with pneumonia. Recommend followup films to clearing.   Electronically Signed   By: Drusilla Kannerhomas  Dalessio M.D.   On: 02/23/2015 19:44    MDM  Code sepsis called based on fever, respiratory rate, leukocytosis, source is respiratory. Patient has oxygen requirement therefore will admit spoke with Dr.Kakrakandy plan admit step down unit, oxygen, intravenous antibiotics and intravenous fluids Final diagnoses:  None  Strongly doubt pe , based on cough, fever, leukocytosis cxr, no claf pain or tenderness. CRITICAL CARE Performed by: Doug SouJACUBOWITZ,Tahjir Silveria Total critical care time: 30 minute Critical care time was exclusive of separately billable procedures and treating other patients. Critical care was necessary to treat or prevent imminent or life-threatening deterioration. Critical care was time spent personally by me on the following activities: development of treatment plan with patient and/or surrogate as well as nursing, discussions with consultants, evaluation of patient's response to treatment, examination of patient, obtaining history from patient or surrogate, ordering and performing treatments and interventions, ordering and review of laboratory studies, ordering and review of radiographic studies, pulse oximetry and re-evaluation of patient's condition.  Dx  #1 CAP  #2 hypoxia #3 hyponatremia #4 hypokalemia    Doug SouSam Adelee Hannula, MD 02/23/15 2045  Doug SouSam Lorena Benham, MD 02/23/15 2048

## 2015-02-23 NOTE — H&P (Signed)
[facility] History and Physical  [name] KeaDonnie Stewart JKK:[id number] DOB: 1961-07-20 DOA: 02/23/2015  Referring physician: Dr. Narda AmberJacobowictz. PCP: Lilia ArgueKAPLAN,KRISTEN, PA-C  Specialists: None.  Chief Complaint: Shortness of breath.  HPI: [name] [name] is a 54 y.o. male history of hypertension has been having productive cough nausea vomiting with shortness of breath over the last 4-5 days. 2 days ago patient's primary care physician had prescribed amoxicillin for possible pneumonia despite taking which patient was still short of breath with productive cough. Patient states he has had multiple episodes of nausea and vomiting. Denies any abdominal pain or diarrhea. In the ER patient was found to be hypoxic with chest x-ray showing left-sided infiltrates and febrile. Patient has been admitted for acute respiratory failure with sepsis secondary to pneumonia. Patient denies any recent travel or sick contacts.   Review of Systems: As presented in the history of presenting illness, rest negative.  Past Medical History  Diagnosis Date  . Hypertension   . Back pain    Past Surgical History  Procedure Laterality Date  . Back surgery     Social History:  reports that he has never smoked. He does not have any smokeless tobacco history on file. He reports that he does not drink alcohol. His drug history is not on file. Where does patient live home. Can patient participate in ADLs? Yes.  Allergies  Allergen Reactions  . Tamiflu [Oseltamivir Phosphate] Itching and Other (See Comments)    Burning sensation and tingling per patient    Family History:  Family History  Problem Relation Age of Onset  . Hypertension Mother   . Diabetes Mellitus II Father   . CAD Father       Prior to Admission medications   Medication Sig Start Date End Date Taking? Authorizing Provider  acetaminophen (TYLENOL) 500 MG tablet Take 500 mg by mouth every 6 (six) hours as needed for fever.   Yes Historical Provider, MD   amoxicillin (AMOXIL) 875 MG tablet Take 875 mg by mouth every 12 (twelve) hours.   Yes Historical Provider, MD  benzonatate (TESSALON) 100 MG capsule Take 100-200 mg by mouth 3 (three) times daily as needed for cough.   Yes Historical Provider, MD  FLUoxetine (PROZAC) 20 MG capsule Take 20 mg by mouth daily.   Yes Historical Provider, MD  guaiFENesin (MUCINEX) 600 MG 12 hr tablet Take 600 mg by mouth 2 (two) times daily as needed for cough or to loosen phlegm.   Yes Historical Provider, MD  guaifenesin (ROBITUSSIN) 100 MG/5ML syrup Take 200 mg by mouth 3 (three) times daily as needed for cough.   Yes Historical Provider, MD  lisinopril (PRINIVIL,ZESTRIL) 10 MG tablet Take 10 mg by mouth daily.   Yes Historical Provider, MD  LORazepam (ATIVAN) 0.5 MG tablet Take 0.5 mg by mouth every 4 (four) hours as needed for anxiety.   Yes Historical Provider, MD  simvastatin (ZOCOR) 20 MG tablet Take 20 mg by mouth daily at 6 PM.   Yes Historical Provider, MD    Physical Exam: Filed Vitals:   02/23/15 2015 02/23/15 2030 02/23/15 2034 02/23/15 2045  BP: 126/70 122/59  119/88  Pulse: 88 84  85  Temp:      TempSrc:      Resp: 26 28  29   Height:   5\' 8"  (1.727 m)   Weight:   119.75 kg (264 lb)   SpO2: 92% 91%  93%     General:  Well-developed and nourished.  Eyes: Anicteric no  pallor.  ENT: No discharge from the ears eyes nose and mouth.  Neck: No mass felt.  Cardiovascular: S1 and S2 heard.  Respiratory: No rhonchi or crepitations.  Abdomen: Soft nontender bowel sounds present.  Skin: No rash.  Musculoskeletal: Mild edema of the right lower extremity.  Psychiatric: Appears normal.  Neurologic: Alert awake oriented to time place and person. Moves all extremities.  Labs on Admission:  Basic Metabolic Panel:  Recent Labs Lab 02/23/15 1902  NA 128*  K 3.3*  CL 90*  CO2 24  GLUCOSE 128*  BUN 14  CREATININE 1.17  CALCIUM 8.8*   Liver Function Tests:  Recent Labs Lab  02/23/15 1902  AST 116*  ALT 82*  ALKPHOS 76  BILITOT 1.3*  PROT 7.7  ALBUMIN 3.0*   No results for input(s): LIPASE, AMYLASE in the last 168 hours. No results for input(s): AMMONIA in the last 168 hours. CBC:  Recent Labs Lab 02/23/15 1902  WBC 19.3*  NEUTROABS 16.6*  HGB 15.2  HCT 42.4  MCV 84.6  PLT 264   Cardiac Enzymes: No results for input(s): CKTOTAL, CKMB, CKMBINDEX, TROPONINI in the last 168 hours.  BNP (last 3 results)  Recent Labs  02/23/15 1902  BNP 47.9    ProBNP (last 3 results) No results for input(s): PROBNP in the last 8760 hours.  CBG: No results for input(s): GLUCAP in the last 168 hours.  Radiological Exams on Admission: Dg Chest 2 View  02/23/2015   CLINICAL DATA:  Chest pain, shortness of breath, cough and fever since 02/20/2015.  EXAM: CHEST  2 VIEW  COMPARISON:  PA and lateral chest 12/24/2008.  FINDINGS: Extensive airspace disease is seen the left chest, worst in the lingula. The right lung appears clear. Heart size is normal. No pneumothorax or pleural effusion.  IMPRESSION: Extensive left side airspace disease most consistent with pneumonia. Recommend followup films to clearing.   Electronically Signed   By: Drusilla Kannerhomas  Dalessio M.D.   On: 02/23/2015 19:44    EKG: Independently reviewed. Normal sinus rhythm.  Assessment/Plan Principal Problem:   Acute respiratory failure with hypoxia Active Problems:   Community acquired pneumonia   Sepsis   Elevated LFTs   Nausea & vomiting   Hyponatremia   Hypertension   Acute respiratory failure   1. Acute hypoxic respiratory failure with sepsis from pneumonia - patient has been placed on ceftriaxone and Zithromax for pneumonia from community-acquired. Check blood cultures and influenza PCR HIV status urine for Legionella and strep antigen and follow lactic acid levels and for calcitonin levels. Continue with hydration. Since patient had some mild swelling in the right lower extremity check  Dopplers to rule out DVT. 2. Elevated LFTs may be from sepsis but since patient has nausea and vomiting we will check sonogram of the abdomen. At this time abdomen appears benign. Follow LFTs. Check acute hepatitis panel. Check Tylenol levels. 3. Hyponatremia and hypokalemia probably from nausea and vomiting - replace and recheck. Continue hydration. Follow metabolic panel closely. 4. Hypertension on lisinopril which will be continued.   Note that patient is allergic to Tamiflu.   DVT Prophylaxis Lovenox.  Code Status: Full code.  Family Communication: Patient's wife.  Disposition Plan: Admit to inpatient to stepdown. Likely stay 2-3 days.    Krystol Rocco N. [facility] Pager [phone].  If 7PM-7AM, please contact night-coverage [email or web address] Password [email or web address] 02/23/2015, 9:20 PM

## 2015-02-23 NOTE — ED Notes (Signed)
Patient transported to X-ray 

## 2015-02-23 NOTE — ED Notes (Signed)
Pt here for fever, right calf pain, SOB and N/V/D; pt noted to have low O2 sats

## 2015-02-23 NOTE — ED Notes (Signed)
Family at bedside. 

## 2015-02-23 NOTE — Progress Notes (Signed)
ANTIBIOTIC CONSULT NOTE - INITIAL  Pharmacy Consult for azithromycin and ceftriaxone Indication: pneumonia  Allergies  Allergen Reactions  . Tamiflu [Oseltamivir Phosphate] Itching and Other (See Comments)    Burning sensation and tingling per patient    Patient Measurements: Height: 5\' 8"  (172.7 cm) Weight: 264 lb (119.75 kg) IBW/kg (Calculated) : 68.4   Vital Signs: Temp: 101.5 F (38.6 C) (05/18 2128) Temp Source: Oral (05/18 2128) BP: 130/64 mmHg (05/18 2130) Pulse Rate: 84 (05/18 2130) Intake/Output from previous day:   Intake/Output from this shift:    Labs:  Recent Labs  02/23/15 1902  WBC 19.3*  HGB 15.2  PLT 264  CREATININE 1.17   Estimated Creatinine Clearance: 94 mL/min (by C-G formula based on Cr of 1.17). No results for input(s): VANCOTROUGH, VANCOPEAK, VANCORANDOM, GENTTROUGH, GENTPEAK, GENTRANDOM, TOBRATROUGH, TOBRAPEAK, TOBRARND, AMIKACINPEAK, AMIKACINTROU, AMIKACIN in the last 72 hours.   Microbiology: No results found for this or any previous visit (from the past 720 hour(s)).  Medical History: Past Medical History  Diagnosis Date  . Hypertension   . Back pain     Medications:  Prescriptions prior to admission  Medication Sig Dispense Refill Last Dose  . acetaminophen (TYLENOL) 500 MG tablet Take 500 mg by mouth every 6 (six) hours as needed for fever.   02/21/2015  . amoxicillin (AMOXIL) 875 MG tablet Take 875 mg by mouth every 12 (twelve) hours.   02/23/2015 at Unknown time  . benzonatate (TESSALON) 100 MG capsule Take 100-200 mg by mouth 3 (three) times daily as needed for cough.   02/23/2015 at Unknown time  . FLUoxetine (PROZAC) 20 MG capsule Take 20 mg by mouth daily.   2 days ago at Unknown time  . guaiFENesin (MUCINEX) 600 MG 12 hr tablet Take 600 mg by mouth 2 (two) times daily as needed for cough or to loosen phlegm.   02/19/2015  . guaifenesin (ROBITUSSIN) 100 MG/5ML syrup Take 200 mg by mouth 3 (three) times daily as needed for  cough.   02/21/2015  . lisinopril (PRINIVIL,ZESTRIL) 10 MG tablet Take 10 mg by mouth daily.   2 days ago at Unknown time  . LORazepam (ATIVAN) 0.5 MG tablet Take 0.5 mg by mouth every 4 (four) hours as needed for anxiety.   02/23/2015 at Unknown time  . simvastatin (ZOCOR) 20 MG tablet Take 20 mg by mouth daily at 6 PM.   2 days ago at Unknown time   Assessment: 54 yo to start antibiotics for PNA.  Goal of Therapy:  Eradication of infection  Plan:  Cont antibiotics as ordered, ceftriaxone 1 gm IV q24 hours and azithromycin 500 mg IV X 1 then 250 mg IV daily  Brock Larmon Poteet 02/23/2015,10:28 PM

## 2015-02-24 ENCOUNTER — Inpatient Hospital Stay (HOSPITAL_COMMUNITY): Payer: Medicare Other

## 2015-02-24 DIAGNOSIS — J9601 Acute respiratory failure with hypoxia: Secondary | ICD-10-CM

## 2015-02-24 DIAGNOSIS — I10 Essential (primary) hypertension: Secondary | ICD-10-CM

## 2015-02-24 DIAGNOSIS — E871 Hypo-osmolality and hyponatremia: Secondary | ICD-10-CM

## 2015-02-24 DIAGNOSIS — R112 Nausea with vomiting, unspecified: Secondary | ICD-10-CM

## 2015-02-24 DIAGNOSIS — R7989 Other specified abnormal findings of blood chemistry: Secondary | ICD-10-CM

## 2015-02-24 DIAGNOSIS — M79609 Pain in unspecified limb: Secondary | ICD-10-CM

## 2015-02-24 DIAGNOSIS — J189 Pneumonia, unspecified organism: Secondary | ICD-10-CM

## 2015-02-24 DIAGNOSIS — A419 Sepsis, unspecified organism: Principal | ICD-10-CM

## 2015-02-24 LAB — BASIC METABOLIC PANEL
Anion gap: 9 (ref 5–15)
BUN: 12 mg/dL (ref 6–20)
CALCIUM: 7.9 mg/dL — AB (ref 8.9–10.3)
CO2: 28 mmol/L (ref 22–32)
Chloride: 95 mmol/L — ABNORMAL LOW (ref 101–111)
Creatinine, Ser: 1.11 mg/dL (ref 0.61–1.24)
GFR calc Af Amer: >60 mL/min (ref >60)
GFR calc non Af Amer: >60 mL/min (ref >60)
GLUCOSE: 159 mg/dL — AB (ref 65–99)
Potassium: 3.6 mmol/L (ref 3.5–5.1)
Sodium: 132 mmol/L — ABNORMAL LOW (ref 135–145)

## 2015-02-24 LAB — HEPATIC FUNCTION PANEL
ALBUMIN: 2.5 g/dL — AB (ref 3.5–5.0)
ALK PHOS: 71 U/L (ref 38–126)
ALT: 81 U/L — ABNORMAL HIGH (ref 17–63)
AST: 105 U/L — ABNORMAL HIGH (ref 15–41)
BILIRUBIN TOTAL: 0.9 mg/dL (ref 0.3–1.2)
Bilirubin, Direct: 0.4 mg/dL (ref 0.1–0.5)
Indirect Bilirubin: 0.5 mg/dL (ref 0.3–0.9)
Total Protein: 6.7 g/dL (ref 6.5–8.1)

## 2015-02-24 LAB — PROCALCITONIN: PROCALCITONIN: 0.53 ng/mL

## 2015-02-24 LAB — HEPATITIS PANEL, ACUTE
HCV AB: NEGATIVE
HEP B C IGM: NONREACTIVE
HEP B S AG: NEGATIVE
Hep A IgM: NONREACTIVE

## 2015-02-24 LAB — CBC WITH DIFFERENTIAL/PLATELET
BASOS ABS: 0 10*3/uL (ref 0.0–0.1)
BASOS PCT: 0 % (ref 0–1)
EOS ABS: 0.1 10*3/uL (ref 0.0–0.7)
Eosinophils Relative: 0 % (ref 0–5)
HEMATOCRIT: 38.6 % — AB (ref 39.0–52.0)
HEMOGLOBIN: 13.4 g/dL (ref 13.0–17.0)
Lymphocytes Relative: 10 % — ABNORMAL LOW (ref 12–46)
Lymphs Abs: 1.8 10*3/uL (ref 0.7–4.0)
MCH: 29.8 pg (ref 26.0–34.0)
MCHC: 34.7 g/dL (ref 30.0–36.0)
MCV: 86 fL (ref 78.0–100.0)
Monocytes Absolute: 0.8 10*3/uL (ref 0.1–1.0)
Monocytes Relative: 4 % (ref 3–12)
NEUTROS ABS: 15.4 10*3/uL — AB (ref 1.7–7.7)
NEUTROS PCT: 86 % — AB (ref 43–77)
PLATELETS: 246 10*3/uL (ref 150–400)
RBC: 4.49 MIL/uL (ref 4.22–5.81)
RDW: 13.6 % (ref 11.5–15.5)
WBC: 18.1 10*3/uL — ABNORMAL HIGH (ref 4.0–10.5)

## 2015-02-24 LAB — CREATININE, SERUM
CREATININE: 1.22 mg/dL (ref 0.61–1.24)
GFR calc Af Amer: >60 mL/min (ref >60)
GFR calc non Af Amer: >60 mL/min (ref >60)

## 2015-02-24 LAB — HIV ANTIBODY (ROUTINE TESTING W REFLEX): HIV SCREEN 4TH GENERATION: NONREACTIVE

## 2015-02-24 LAB — ACETAMINOPHEN LEVEL: Acetaminophen (Tylenol), Serum: <10 ug/mL — ABNORMAL LOW (ref 10–30)

## 2015-02-24 LAB — PROTIME-INR
INR: 1.26 (ref 0.00–1.49)
Prothrombin Time: 15.9 seconds — ABNORMAL HIGH (ref 11.6–15.2)

## 2015-02-24 LAB — MAGNESIUM: MAGNESIUM: 2.1 mg/dL (ref 1.7–2.4)

## 2015-02-24 LAB — MRSA PCR SCREENING: MRSA by PCR: NEGATIVE

## 2015-02-24 LAB — INFLUENZA PANEL BY PCR (TYPE A & B)
H1N1 flu by pcr: NOT DETECTED
INFLBPCR: NEGATIVE
Influenza A By PCR: NEGATIVE

## 2015-02-24 LAB — APTT: aPTT: 36 seconds (ref 24–37)

## 2015-02-24 LAB — STREP PNEUMONIAE URINARY ANTIGEN: STREP PNEUMO URINARY ANTIGEN: NEGATIVE

## 2015-02-24 MED ORDER — METHYLPREDNISOLONE SODIUM SUCC 125 MG IJ SOLR
60.0000 mg | INTRAMUSCULAR | Status: DC
  Administered 2015-02-24 – 2015-02-26 (×2): 60 mg via INTRAVENOUS
  Filled 2015-02-24 (×2): qty 0.96
  Filled 2015-02-24: qty 2

## 2015-02-24 MED ORDER — DM-GUAIFENESIN ER 30-600 MG PO TB12
1.0000 | ORAL_TABLET | Freq: Two times a day (BID) | ORAL | Status: DC
  Administered 2015-02-24 – 2015-02-28 (×8): 1 via ORAL
  Filled 2015-02-24 (×11): qty 1

## 2015-02-24 MED ORDER — DEXTROSE 5 % IV SOLN
250.0000 mg | INTRAVENOUS | Status: DC
  Administered 2015-02-24 – 2015-02-25 (×2): 250 mg via INTRAVENOUS
  Filled 2015-02-24 (×4): qty 250

## 2015-02-24 NOTE — Progress Notes (Signed)
Right Lower Ext. Venous Duplex Completed. Negative for DVT or SVT in the right lower extremity. Marilynne Halstedita Assata Juncaj, BS, RDMS, RVT

## 2015-02-24 NOTE — Progress Notes (Signed)
[place] TEAM 1 - Stepdown/ICU TEAM Progress Note  [name] SalvageDonnie XXXAmos [name] DOB: 03-25-1961 DOA: 02/23/2015 PCP: Lilia ArgueKAPLAN,KRISTEN, PA-C  Admit HPI / Brief Narrative: [name] [name] is a 54 y.o.WM PMHx anxiety, hypertension, HLD  has been having productive cough nausea vomiting with shortness of breath over the last 4-5 days. 2 days ago patient's primary care physician had prescribed amoxicillin for possible pneumonia despite taking which patient was still short of breath with productive cough. Patient states he has had multiple episodes of nausea and vomiting. Denies any abdominal pain or diarrhea. In the ER patient was found to be hypoxic with chest x-ray showing left-sided infiltrates and febrile. Patient has been admitted for acute respiratory failure with sepsis secondary to pneumonia. Patient denies any recent travel or sick contacts.    HPI/Subjective: 5/19 A/O 4, negative N/V negative CP, positive SOB especially with exertion even while on O2. Patient states negative smoking history, negative use of home O2.   Assessment/Plan: Acute hypoxic respiratory failure with hypoxia/Sepsis from CAP  - Continue current antibiotics  -Solu-Medrol 60 mg daily  -Mucinex DM  BID -Sputum culture pending -patient has been placed on ceftriaxone and Zithromax for pneumonia from community-acquired. Check blood cultures and influenza PCR HIV status urine for Legionella and strep antigen and follow lactic acid levels and for calcitonin levels. Continue with hydration. Since patient had some mild swelling in the right lower extremity check Dopplers to rule out DVT.  Elevated LFTs  -Nominal ultrasound negative see results below -Will trend liver enzymes; shock liver? -Acute hepatitis panel negative -Tylenol level within normal limit  HTN -Continue lisinopril 10 mg daily  Hyponatremia -Most likely secondary to dehydration from nausea and vomiting -Continue normal saline 12600ml/hr  Hypokalemia    -Resolved, continue to monitor closely     Code Status: FULL Family Communication: no family present at time of exam Disposition Plan: Resolution acute respiratory failure    Consultants: NA  Procedure/Significant Events: 5/19 echocardiogram pending 5/19 abdominal ultrasound;No gallstones.- Mild thickening gallbladder wall w/o sonographic Murphy's sign. -Normal CBD. -Diffuse increased echogenicity of the liver suspicious for fatty infiltration.    Culture 5/18 influenza A/B/H1N1 negative 5/18 HIV negative 5/19 respiratory virus panel pending 5/19 sputum culture pending 5/19 acute hepatitis panel negative   Antibiotics: Azithromycin 5/19>> Ceftriaxone 5/19>>   DVT prophylaxis: Lovenox   Devices    LINES / TUBES:      Continuous Infusions: . sodium chloride      Objective: VITAL SIGNS: Temp: 98.5 F (36.9 C) (05/20 0500) Temp Source: Oral (05/20 0500) BP: 112/68 mmHg (05/20 0445) Pulse Rate: 64 (05/20 0445) SPO2; FIO2:   Intake/Output Summary (Last 24 hours) at 02/25/15 0705 Last data filed at 02/25/15 0538  Gross per 24 hour  Intake   1595 ml  Output   1475 ml  Net    120 ml     Exam: General: A/O 4, acute respiratory distress (off O2 with any exertion he sats quickly to low 80s) Lungs: tachypnea, diffuse poor air movement, negative wheezes or crackles Cardiovascular: Tachycardic, Regular rhythm without murmur gallop or rub normal S1 and S2 Abdomen: Nontender, nondistended, soft, bowel sounds positive, no rebound, no ascites, no appreciable mass Extremities: No significant cyanosis, clubbing, or edema bilateral lower extremities  Data Reviewed: Basic Metabolic Panel:  Recent Labs Lab 02/23/15 1902 02/23/15 2255 02/24/15 0309 02/25/15 0235  NA 128*  --  132* 133*  K 3.3*  --  3.6 3.5  CL 90*  --  95* 100*  CO2 24  --  28 26  GLUCOSE 128*  --  159* 145*  BUN 14  --  12 9  CREATININE 1.17 1.22 1.11 0.83  CALCIUM 8.8*  --   7.9* 7.9*  MG  --  2.1  --  2.5*   Liver Function Tests:  Recent Labs Lab 02/23/15 1902 02/24/15 0309 02/25/15 0235  AST 116* 105* 70*  ALT 82* 81* 76*  ALKPHOS 76 71 75  BILITOT 1.3* 0.9 0.7  PROT 7.7 6.7 6.4*  ALBUMIN 3.0* 2.5* 2.4*   No results for input(s): LIPASE, AMYLASE in the last 168 hours. No results for input(s): AMMONIA in the last 168 hours. CBC:  Recent Labs Lab 02/23/15 1902 02/23/15 2255 02/24/15 0309 02/25/15 0235  WBC 19.3* 19.0* 18.1* 15.5*  NEUTROABS 16.6*  --  15.4* 14.0*  HGB 15.2 13.4 13.4 12.9*  HCT 42.4 38.1* 38.6* 37.4*  MCV 84.6 85.2 86.0 86.6  PLT 264 248 246 296   Cardiac Enzymes: No results for input(s): CKTOTAL, CKMB, CKMBINDEX, TROPONINI in the last 168 hours. BNP (last 3 results)  Recent Labs  02/23/15 1902  BNP 47.9    ProBNP (last 3 results) No results for input(s): PROBNP in the last 8760 hours.  CBG: No results for input(s): GLUCAP in the last 168 hours.  Recent Results (from the past 240 hour(s))  MRSA PCR Screening     Status: None   Collection Time: 02/23/15 11:01 PM  Result Value Ref Range Status   MRSA by PCR NEGATIVE NEGATIVE Final    Comment:        The GeneXpert MRSA Assay (FDA approved for NASAL specimens only), is one component of a comprehensive MRSA colonization surveillance program. It is not intended to diagnose MRSA infection nor to guide or monitor treatment for MRSA infections.      Studies:  Recent x-ray studies have been reviewed in detail by the Attending Physician  Scheduled Meds:  Scheduled Meds: . azithromycin  250 mg Intravenous Q24H  . cefTRIAXone (ROCEPHIN)  IV  1 g Intravenous Q24H  . dextromethorphan-guaiFENesin  1 tablet Oral BID  . enoxaparin (LOVENOX) injection  40 mg Subcutaneous Q24H  . FLUoxetine  20 mg Oral Daily  . lisinopril  10 mg Oral Daily  . methylPREDNISolone (SOLU-MEDROL) injection  60 mg Intravenous Q24H  . simvastatin  20 mg Oral q1800    Time spent on  care of this patient: 40 mins   Eloyse Causey, Roselind MessierURTIS J , MD  [facility] Office  [phone] Pager - [phone]  On-Call/Text Page:      [email or web address] [email or web address]      password [email or web address]  If 7PM-7AM, please contact night-coverage [email or web address] Password [email or web address] 02/25/2015, 7:05 AM   LOS: 2 days   Care during the described time interval was provided by me .  I have reviewed this patient's available data, including medical history, events of note, physical examination, radiology studies and test results as part of my evaluation  Carolyne Littlesurtis Kawena Lyday, MD [phone] Pager

## 2015-02-25 ENCOUNTER — Inpatient Hospital Stay (HOSPITAL_COMMUNITY): Payer: Medicare Other

## 2015-02-25 DIAGNOSIS — I350 Nonrheumatic aortic (valve) stenosis: Secondary | ICD-10-CM

## 2015-02-25 DIAGNOSIS — J189 Pneumonia, unspecified organism: Secondary | ICD-10-CM

## 2015-02-25 DIAGNOSIS — A419 Sepsis, unspecified organism: Secondary | ICD-10-CM | POA: Diagnosis present

## 2015-02-25 LAB — BLOOD GAS, ARTERIAL
Acid-Base Excess: 1.5 mmol/L (ref 0.0–2.0)
BICARBONATE: 25.6 meq/L — AB (ref 20.0–24.0)
Drawn by: 11249
FIO2: 0.21 %
O2 Saturation: 93.5 %
Patient temperature: 98.5
TCO2: 26.8 mmol/L (ref 0–100)
pCO2 arterial: 40.4 mmHg (ref 35.0–45.0)
pH, Arterial: 7.418 (ref 7.350–7.450)
pO2, Arterial: 64.8 mmHg — ABNORMAL LOW (ref 80.0–100.0)

## 2015-02-25 LAB — COMPREHENSIVE METABOLIC PANEL
ALBUMIN: 2.4 g/dL — AB (ref 3.5–5.0)
ALK PHOS: 75 U/L (ref 38–126)
ALT: 76 U/L — AB (ref 17–63)
AST: 70 U/L — AB (ref 15–41)
Anion gap: 7 (ref 5–15)
BUN: 9 mg/dL (ref 6–20)
CO2: 26 mmol/L (ref 22–32)
Calcium: 7.9 mg/dL — ABNORMAL LOW (ref 8.9–10.3)
Chloride: 100 mmol/L — ABNORMAL LOW (ref 101–111)
Creatinine, Ser: 0.83 mg/dL (ref 0.61–1.24)
GFR calc Af Amer: >60 mL/min (ref >60)
GFR calc non Af Amer: >60 mL/min (ref >60)
Glucose, Bld: 145 mg/dL — ABNORMAL HIGH (ref 65–99)
POTASSIUM: 3.5 mmol/L (ref 3.5–5.1)
SODIUM: 133 mmol/L — AB (ref 135–145)
TOTAL PROTEIN: 6.4 g/dL — AB (ref 6.5–8.1)
Total Bilirubin: 0.7 mg/dL (ref 0.3–1.2)

## 2015-02-25 LAB — CBC WITH DIFFERENTIAL/PLATELET
BASOS PCT: 0 % (ref 0–1)
Basophils Absolute: 0 10*3/uL (ref 0.0–0.1)
Eosinophils Absolute: 0.3 10*3/uL (ref 0.0–0.7)
Eosinophils Relative: 2 % (ref 0–5)
HCT: 37.4 % — ABNORMAL LOW (ref 39.0–52.0)
Hemoglobin: 12.9 g/dL — ABNORMAL LOW (ref 13.0–17.0)
Lymphocytes Relative: 5 % — ABNORMAL LOW (ref 12–46)
Lymphs Abs: 0.7 10*3/uL (ref 0.7–4.0)
MCH: 29.9 pg (ref 26.0–34.0)
MCHC: 34.5 g/dL (ref 30.0–36.0)
MCV: 86.6 fL (ref 78.0–100.0)
MONO ABS: 0.5 10*3/uL (ref 0.1–1.0)
Monocytes Relative: 3 % (ref 3–12)
NEUTROS PCT: 91 % — AB (ref 43–77)
Neutro Abs: 14 10*3/uL — ABNORMAL HIGH (ref 1.7–7.7)
PLATELETS: 296 10*3/uL (ref 150–400)
RBC: 4.32 MIL/uL (ref 4.22–5.81)
RDW: 13.9 % (ref 11.5–15.5)
WBC: 15.5 10*3/uL — ABNORMAL HIGH (ref 4.0–10.5)

## 2015-02-25 LAB — MAGNESIUM: Magnesium: 2.5 mg/dL — ABNORMAL HIGH (ref 1.7–2.4)

## 2015-02-25 LAB — LEGIONELLA ANTIGEN, URINE

## 2015-02-25 LAB — LACTIC ACID, PLASMA: LACTIC ACID, VENOUS: 0.9 mmol/L (ref 0.5–2.0)

## 2015-02-25 MED ORDER — SODIUM CHLORIDE 0.9 % IV SOLN
INTRAVENOUS | Status: DC
  Administered 2015-02-25 – 2015-02-28 (×5): via INTRAVENOUS

## 2015-02-25 MED ORDER — POLYETHYLENE GLYCOL 3350 17 G PO PACK
17.0000 g | PACK | Freq: Every day | ORAL | Status: DC
  Administered 2015-02-25 – 2015-02-26 (×2): 17 g via ORAL
  Filled 2015-02-25 (×4): qty 1

## 2015-02-25 NOTE — Progress Notes (Signed)
[name] [name] Progress Note  [name] [name] XXXAmos WJX:[id number] DOB: [birth year] DOA: 02/23/2015 PCP: Lilia ArgueKAPLAN,KRISTEN, PA-C  Admit HPI / Brief Narrative: [name] [name] is a 54 y.o.WM PMHx anxiety, hypertension, HLD  has been having productive cough nausea vomiting with shortness of breath over the last 4-5 days. 2 days ago patient's primary care physician had prescribed amoxicillin for possible pneumonia despite taking which patient was still short of breath with productive cough. Patient states he has had multiple episodes of nausea and vomiting. Denies any abdominal pain or diarrhea. In the ER patient was found to be hypoxic with chest x-ray showing left-sided infiltrates and febrile. Patient has been admitted for acute respiratory failure with sepsis secondary to pneumonia. Patient denies any recent travel or sick contacts.    HPI/Subjective: 5/ 20 A/O 4, negative N/V negative CP, positive SOB especially with exertion even while on O2 but improved from 5/19.   Assessment/Plan: Acute hypoxic respiratory failure with hypoxia/Sepsis from CAP  -Continue current antibiotics  -Solu-Medrol 60 mg daily  -Mucinex DM  BID -Sputum culture pending -patient has been placed on ceftriaxone and Zithromax for CAP   Elevated LFTs  -Most likely multifactorial to include fatty liver, and shock liver from hypoxia.  -Ultrasound; fatty liver C results below  -Liver enzymes trending down -Acute hepatitis panel negative -Tylenol level within normal limit  HTN -Continue lisinopril 10 mg daily  Hyponatremia -Most likely secondary to dehydration from nausea and vomiting. -increase normal saline 12725ml/hr  Hypokalemia  -Resolved, continue to monitor closely     Code Status: FULL Family Communication: no family present at time of exam Disposition Plan: Resolution acute respiratory failure    Consultants: NA  Procedure/Significant Events: 5/19 echocardiogram pending 5/19  abdominal ultrasound;No gallstones.- Mild thickening gallbladder wall w/o sonographic Murphy's sign. -Normal CBD. -Diffuse increased echogenicity of the liver suspicious for fatty infiltration. 5/20 Echogardiogram;- LVEF= 50%- 55%.   5/19 RLE venous duplex;Negative for DVT or SVT   Culture 5/18 influenza A/B/H1N1 negative 5/18 HIV negative 5/18 Legionella Urine Ag Negative 5/18 Strep Pneumo Ag Negative 5/19 respiratory virus panel pending 5/19 sputum culture pending 5/19 acute hepatitis panel negative   Antibiotics: Azithromycin 5/19>> Ceftriaxone 5/19>>   DVT prophylaxis: Lovenox   Devices    LINES / TUBES:      Continuous Infusions: . sodium chloride 100 mL/hr at 02/25/15 0715    Objective: VITAL SIGNS: Temp: 97.7 F (36.5 C) (05/20 1300) Temp Source: Oral (05/20 1300) SPO2; FIO2:   Intake/Output Summary (Last 24 hours) at 02/25/15 1701 Last data filed at 02/25/15 [id number]  Gross per 24 hour  Intake      0 ml  Output   1325 ml  Net  -1325 ml     Exam: General: A/O 4, acute respiratory distress (off O2 with any exertion he sats quickly to low 80s) Eyes: Negative retinal hemorrhage, pupils equal react to light and accommodation ENT: Negative Runny nose, negative ear pain, negative tinnitus, negative gingival bleeding,  Neck: Negative scars, masses, torticollis, lymphadenopathy, JVD Lungs: tachypnea, diffuse poor air movement, negative wheezes or crackles Cardiovascular: Tachycardic, Regular rhythm without murmur gallop or rub normal S1 and S2 Abdomen: Nontender, nondistended, soft, bowel sounds positive, no rebound, no ascites, no appreciable mass Extremities: No significant cyanosis, clubbing, or edema bilateral lower extremities Psychiatric: Negative depression, negative anxiety, negative fatigue, negative mania  Neurologic: Cranial nerves II through XII intact, all extremities muscle strength 5/5,negative dysarthria, negative expressive aphasia,  negative receptive aphasia.  Data Reviewed: Basic Metabolic Panel:  Recent Labs Lab 02/23/15 1902 02/23/15 2255 02/24/15 0309 02/25/15 0235  NA 128*  --  132* 133*  K 3.3*  --  3.6 3.5  CL 90*  --  95* 100*  CO2 24  --  28 26  GLUCOSE 128*  --  159* 145*  BUN 14  --  12 9  CREATININE 1.17 1.22 1.11 0.83  CALCIUM 8.8*  --  7.9* 7.9*  MG  --  2.1  --  2.5*   Liver Function Tests:  Recent Labs Lab 02/23/15 1902 02/24/15 0309 02/25/15 0235  AST 116* 105* 70*  ALT 82* 81* 76*  ALKPHOS 76 71 75  BILITOT 1.3* 0.9 0.7  PROT 7.7 6.7 6.4*  ALBUMIN 3.0* 2.5* 2.4*   No results for input(s): LIPASE, AMYLASE in the last 168 hours. No results for input(s): AMMONIA in the last 168 hours. CBC:  Recent Labs Lab 02/23/15 1902 02/23/15 2255 02/24/15 0309 02/25/15 0235  WBC 19.3* 19.0* 18.1* 15.5*  NEUTROABS 16.6*  --  15.4* 14.0*  HGB 15.2 13.4 13.4 12.9*  HCT 42.4 38.1* 38.6* 37.4*  MCV 84.6 85.2 86.0 86.6  PLT 264 248 246 296   Cardiac Enzymes: No results for input(s): CKTOTAL, CKMB, CKMBINDEX, TROPONINI in the last 168 hours. BNP (last 3 results)  Recent Labs  02/23/15 1902  BNP 47.9    ProBNP (last 3 results) No results for input(s): PROBNP in the last 8760 hours.  CBG: No results for input(s): GLUCAP in the last 168 hours.  Recent Results (from the past 240 hour(s))  Blood Culture (routine x 2)     Status: None (Preliminary result)   Collection Time: 02/23/15  8:18 PM  Result Value Ref Range Status   Specimen Description BLOOD RIGHT ARM  Final   Special Requests BOTTLES DRAWN AEROBIC AND ANAEROBIC 5CC  Final   Culture   Final           BLOOD CULTURE RECEIVED NO GROWTH TO DATE CULTURE WILL BE HELD FOR 5 DAYS BEFORE ISSUING A FINAL NEGATIVE REPORT [facility]    Report Status PENDING  Incomplete  Blood Culture (routine x 2)     Status: None (Preliminary result)   Collection Time: 02/23/15  8:26 PM  Result Value Ref Range  Status   Specimen Description BLOOD LEFT ARM  Final   Special Requests BOTTLES DRAWN AEROBIC AND ANAEROBIC 5CC  Final   Culture   Final           BLOOD CULTURE RECEIVED NO GROWTH TO DATE CULTURE WILL BE HELD FOR 5 DAYS BEFORE ISSUING A FINAL NEGATIVE REPORT Performed at [facility]    Report Status PENDING  Incomplete  MRSA PCR Screening     Status: None   Collection Time: 02/23/15 11:01 PM  Result Value Ref Range Status   MRSA by PCR NEGATIVE NEGATIVE Final    Comment:        The GeneXpert MRSA Assay (FDA approved for NASAL specimens only), is one component of a comprehensive MRSA colonization surveillance program. It is not intended to diagnose MRSA infection nor to guide or monitor treatment for MRSA infections.      Studies:  Recent x-ray studies have been reviewed in detail by the Attending Physician  Scheduled Meds:  Scheduled Meds: . azithromycin  250 mg Intravenous Q24H  . cefTRIAXone (ROCEPHIN)  IV  1 g Intravenous Q24H  . dextromethorphan-guaiFENesin  1 tablet  Oral BID  . enoxaparin (LOVENOX) injection  40 mg Subcutaneous Q24H  . FLUoxetine  20 mg Oral Daily  . lisinopril  10 mg Oral Daily  . methylPREDNISolone (SOLU-MEDROL) injection  60 mg Intravenous Q24H  . simvastatin  20 mg Oral q1800    Time spent on care of this patient: 40 mins   Drishti Pepperman, Roselind Messier , MD  [facility] Office  [phone] Pager - [phone]  On-Call/Text Page:      [email or web address]      password [email or web address]  If 7PM-7AM, please contact night-coverage [email or web address] Password [email or web address] 02/25/2015, 5:01 PM   LOS: 2 days   Care during the described time interval was provided by me .  I have reviewed this patient's available data, including medical history, events of note, physical examination, radiology studies and test results as part of my evaluation  Carolyne Littles, MD [phone] Pager

## 2015-02-25 NOTE — Progress Notes (Signed)
  Echocardiogram 2D Echocardiogram has been performed.  [name] [name], [name] [name] 02/25/2015, 12:45 PM

## 2015-02-26 DIAGNOSIS — E876 Hypokalemia: Secondary | ICD-10-CM

## 2015-02-26 LAB — CBC WITH DIFFERENTIAL/PLATELET
BASOS PCT: 0 % (ref 0–1)
Basophils Absolute: 0 10*3/uL (ref 0.0–0.1)
Eosinophils Absolute: 0.2 10*3/uL (ref 0.0–0.7)
Eosinophils Relative: 1 % (ref 0–5)
HCT: 39.5 % (ref 39.0–52.0)
Hemoglobin: 13.3 g/dL (ref 13.0–17.0)
LYMPHS ABS: 2.2 10*3/uL (ref 0.7–4.0)
Lymphocytes Relative: 14 % (ref 12–46)
MCH: 29.7 pg (ref 26.0–34.0)
MCHC: 33.7 g/dL (ref 30.0–36.0)
MCV: 88.2 fL (ref 78.0–100.0)
Monocytes Absolute: 0.9 10*3/uL (ref 0.1–1.0)
Monocytes Relative: 6 % (ref 3–12)
NEUTROS PCT: 79 % — AB (ref 43–77)
Neutro Abs: 12 10*3/uL — ABNORMAL HIGH (ref 1.7–7.7)
Platelets: 375 10*3/uL (ref 150–400)
RBC: 4.48 MIL/uL (ref 4.22–5.81)
RDW: 14.2 % (ref 11.5–15.5)
WBC: 15.3 10*3/uL — ABNORMAL HIGH (ref 4.0–10.5)

## 2015-02-26 LAB — BLOOD GAS, ARTERIAL
Acid-Base Excess: 0.6 mmol/L (ref 0.0–2.0)
BICARBONATE: 24.8 meq/L — AB (ref 20.0–24.0)
Drawn by: 246101
O2 Content: 4 L/min
O2 Saturation: 96.7 %
PH ART: 7.406 (ref 7.350–7.450)
Patient temperature: 98.6
TCO2: 26 mmol/L (ref 0–100)
pCO2 arterial: 40.3 mmHg (ref 35.0–45.0)
pO2, Arterial: 99.7 mmHg (ref 80.0–100.0)

## 2015-02-26 LAB — COMPREHENSIVE METABOLIC PANEL
ALK PHOS: 66 U/L (ref 38–126)
ALT: 63 U/L (ref 17–63)
AST: 42 U/L — ABNORMAL HIGH (ref 15–41)
Albumin: 2.3 g/dL — ABNORMAL LOW (ref 3.5–5.0)
Anion gap: 9 (ref 5–15)
BUN: 18 mg/dL (ref 6–20)
CALCIUM: 8.4 mg/dL — AB (ref 8.9–10.3)
CO2: 27 mmol/L (ref 22–32)
Chloride: 102 mmol/L (ref 101–111)
Creatinine, Ser: 0.93 mg/dL (ref 0.61–1.24)
GFR calc Af Amer: >60 mL/min (ref >60)
GFR calc non Af Amer: >60 mL/min (ref >60)
Glucose, Bld: 113 mg/dL — ABNORMAL HIGH (ref 65–99)
POTASSIUM: 3.6 mmol/L (ref 3.5–5.1)
SODIUM: 138 mmol/L (ref 135–145)
TOTAL PROTEIN: 6.1 g/dL — AB (ref 6.5–8.1)
Total Bilirubin: 0.5 mg/dL (ref 0.3–1.2)

## 2015-02-26 LAB — MAGNESIUM: Magnesium: 2.3 mg/dL (ref 1.7–2.4)

## 2015-02-26 MED ORDER — METHYLPREDNISOLONE SODIUM SUCC 40 MG IJ SOLR
30.0000 mg | INTRAMUSCULAR | Status: DC
  Administered 2015-02-27: 30 mg via INTRAVENOUS
  Filled 2015-02-26 (×2): qty 0.75

## 2015-02-26 MED ORDER — AZITHROMYCIN 250 MG PO TABS
250.0000 mg | ORAL_TABLET | ORAL | Status: AC
  Administered 2015-02-26: 250 mg via ORAL
  Filled 2015-02-26: qty 1

## 2015-02-26 NOTE — Progress Notes (Signed)
[place] TEAM 1 - Stepdown/ICU TEAM Progress Note  [name] [name] ZOX:[id number] DOB: 1960-11-22 DOA: 02/23/2015 PCP: Lilia Argue  Admit HPI / Brief Narrative: [name] [name] is a 54 y.o.WM PMHx anxiety, hypertension, HLD  has been having productive cough nausea vomiting with shortness of breath over the last 4-5 days. 2 days ago patient's primary care physician had prescribed amoxicillin for possible pneumonia despite taking which patient was still short of breath with productive cough. Patient states he has had multiple episodes of nausea and vomiting. Denies any abdominal pain or diarrhea. In the ER patient was found to be hypoxic with chest x-ray showing left-sided infiltrates and febrile. Patient has been admitted for acute respiratory failure with sepsis secondary to pneumonia. Patient denies any recent travel or sick contacts.    HPI/Subjective: 5/ 21 A/O 4, negative N/V negative CP, continued DOE, however improved from 5/20, continued dry cough. Per wife at approximately 0200 patient woke and began have hallucinations, wondering if this is a side effect of the steroids..   Assessment/Plan: Acute hypoxic respiratory failure with hypoxia/Sepsis from CAP  -Continue current antibiotics  -Decrease Solu-Medrol  daily -ABG now within normal limit -Mucinex DM  BID -Sputum not obtainable -patient has been placed on ceftriaxone and Zithromax for CAP  -Influenza panel negative discontinue droplet protection  Elevated LFTs  -Most likely multifactorial to include fatty liver, and shock liver from hypoxia.  -Ultrasound; fatty liver see results below  -Liver enzymes continue trending down -Acute hepatitis panel negative -Tylenol level within normal limit  HTN -Continue lisinopril 10 mg daily  Hyponatremia -Most likely secondary to dehydration from nausea and vomiting. -Decrease normal saline 8ml/hr  Hypokalemia  -Resolved, continue to monitor closely     Code  Status: FULL Family Communication: no family present at time of exam Disposition Plan: Resolution acute respiratory failure    Consultants: NA  Procedure/Significant Events: 5/19 echocardiogram pending 5/19 abdominal ultrasound;No gallstones.- Mild thickening gallbladder wall w/o sonographic Murphy's sign. -Normal CBD. -Diffuse increased echogenicity of the liver suspicious for fatty infiltration. 5/20 Echogardiogram;- LVEF= 50%- 55%.   5/19 RLE venous duplex;Negative for DVT or SVT   Culture 5/18 influenza A/B/H1N1 negative 5/18 HIV negative 5/18 Legionella Urine Ag Negative 5/18 Strep Pneumo Ag Negative 5/19 respiratory virus panel not collected 5/19 sputum not obtainable 5/19 acute hepatitis panel negative   Antibiotics: Azithromycin 5/19>> Ceftriaxone 5/19>>   DVT prophylaxis: Lovenox   Devices    LINES / TUBES:      Continuous Infusions: . sodium chloride 125 mL/hr at 02/26/15 1533    Objective: VITAL SIGNS: Temp: 97.2 F (36.2 C) (05/21 1212) Temp Source: Oral (05/21 1212) BP: 120/71 mmHg (05/21 1543) Pulse Rate: 62 (05/21 1543) SPO2; FIO2:   Intake/Output Summary (Last 24 hours) at 02/26/15 1630 Last data filed at 02/26/15 1533  Gross per 24 hour  Intake   1120 ml  Output   1475 ml  Net   -355 ml     Exam: General: A/O 4, acute respiratory distress (resolving, patient now ambulated to chair) Eyes: Negative retinal hemorrhage, pupils equal react to light and accommodation ENT: Negative Runny nose, negative ear pain, negative tinnitus, negative gingival bleeding,  Neck: Negative scars, masses, torticollis, lymphadenopathy, JVD Lungs: diffuse poor air movement (improved from 5/20), negative wheezes or crackles Cardiovascular: Regular rhythm and rate without murmur gallop or rub normal S1 and S2 Abdomen: Nontender, nondistended, soft, bowel sounds positive, no rebound, no ascites, no appreciable mass Extremities: No significant  cyanosis, clubbing, or  edema bilateral lower extremities Psychiatric: Negative depression, negative anxiety, negative fatigue, negative mania  Neurologic: Cranial nerves II through XII intact, all extremities muscle strength 5/5,negative dysarthria, negative expressive aphasia, negative receptive aphasia.        Data Reviewed: Basic Metabolic Panel:  Recent Labs Lab 02/23/15 1902 02/23/15 2255 02/24/15 0309 02/25/15 0235 02/26/15 0237  NA 128*  --  132* 133* 138  K 3.3*  --  3.6 3.5 3.6  CL 90*  --  95* 100* 102  CO2 24  --  GLUCOSE 128*  --  159* 145* 113*  BUN 14  --  CREATININE 1.17 1.22 1.11 0.83 0.93  CALCIUM 8.8*  --  7.9* 7.9* 8.4*  MG  --  2.1  --  2.5* 2.3   Liver Function Tests:  Recent Labs Lab 02/23/15 1902 02/24/15 0309 02/25/15 0235 02/26/15 0237  AST 116* 105* 70* 42*  ALT 82* 81* 76* 63  ALKPHOS 76 71 75 66  BILITOT 1.3* 0.9 0.7 0.5  PROT 7.7 6.7 6.4* 6.1*  ALBUMIN 3.0* 2.5* 2.4* 2.3*   No results for input(s): LIPASE, AMYLASE in the last 168 hours. No results for input(s): AMMONIA in the last 168 hours. CBC:  Recent Labs Lab 02/23/15 1902 02/23/15 2255 02/24/15 0309 02/25/15 0235 02/26/15 0237  WBC 19.3* 19.0* 18.1* 15.5* 15.3*  NEUTROABS 16.6*  --  15.4* 14.0* 12.0*  HGB 15.2 13.4 13.4 12.9* 13.3  HCT 42.4 38.1* 38.6* 37.4* 39.5  MCV 84.6 85.2 86.0 86.6 88.2  PLT 264 248 246 296 375   Cardiac Enzymes: No results for input(s): CKTOTAL, CKMB, CKMBINDEX, TROPONINI in the last 168 hours. BNP (last 3 results)  Recent Labs  02/23/15 1902  BNP 47.9    ProBNP (last 3 results) No results for input(s): PROBNP in the last 8760 hours.  CBG: No results for input(s): GLUCAP in the last 168 hours.  Recent Results (from the past 240 hour(s))  Blood Culture (routine x 2)     Status: None (Preliminary result)   Collection Time: 02/23/15  8:18 PM  Result Value Ref Range Status   Specimen Description BLOOD RIGHT ARM   Final   Special Requests BOTTLES DRAWN AEROBIC AND ANAEROBIC 5CC  Final   Culture   Final           BLOOD CULTURE RECEIVED NO GROWTH TO DATE CULTURE WILL BE HELD FOR 5 DAYS BEFORE ISSUING A FINAL NEGATIVE REPORT Performed at [facility]    Report Status PENDING  Incomplete  Blood Culture (routine x 2)     Status: None (Preliminary result)   Collection Time: 02/23/15  8:26 PM  Result Value Ref Range Status   Specimen Description BLOOD LEFT ARM  Final   Special Requests BOTTLES DRAWN AEROBIC AND ANAEROBIC 5CC  Final   Culture   Final           BLOOD CULTURE RECEIVED NO GROWTH TO DATE CULTURE WILL BE HELD FOR 5 DAYS BEFORE ISSUING A FINAL NEGATIVE REPORT Performed at [facility]    Report Status PENDING  Incomplete  MRSA PCR Screening     Status: None   Collection Time: 02/23/15 11:01 PM  Result Value Ref Range Status   MRSA by PCR NEGATIVE NEGATIVE Final    Comment:        The GeneXpert MRSA Assay (FDA approved for NASAL specimens only), is one component of a comprehensive MRSA colonization surveillance  program. It is not intended to diagnose MRSA infection nor to guide or monitor treatment for MRSA infections.      Studies:  Recent x-ray studies have been reviewed in detail by the Attending Physician  Scheduled Meds:  Scheduled Meds: . azithromycin  250 mg Oral Every 24 Hours  . cefTRIAXone (ROCEPHIN)  IV  1 g Intravenous Q24H  . dextromethorphan-guaiFENesin  1 tablet Oral BID  . enoxaparin (LOVENOX) injection  40 mg Subcutaneous Q24H  . FLUoxetine  20 mg Oral Daily  . lisinopril  10 mg Oral Daily  . [START ON 02/27/2015] methylPREDNISolone (SOLU-MEDROL) injection  30 mg Intravenous Q24H  . polyethylene glycol  17 g Oral Daily  . simvastatin  20 mg Oral q1800    Time spent on care of this patient: 40 mins   WOODS, Roselind MessierURTIS J , MD  [facility] Office  [phone] Pager [phone]  On-Call/Text Page:      Loretha [email or web address]      password  [email or web address]  If 7PM-7AM, please contact night-coverage [email or web address] Password [email or web address] 02/26/2015, 4:30 PM   LOS: 3 days   Care during the described time interval was provided by me .  I have reviewed this patient's available data, including medical history, events of note, physical examination, radiology studies and test results as part of my evaluation  Carolyne Littlesurtis Woods, MD [phone] Pager

## 2015-02-26 NOTE — Progress Notes (Addendum)
Patient ambulated on RA. O2 sats as low as 89% but mostly between 90-91%. Patient began ambulating at a steady pace but slowed greatly near end of walk. Patient walked 150 feet (1/2 way around unit). No c/o SOB, dizziness or faintness.   Did state that he would get a "woozy head" at home when he would walk for an extended period of time.

## 2015-02-27 LAB — COMPREHENSIVE METABOLIC PANEL
ALBUMIN: 2.3 g/dL — AB (ref 3.5–5.0)
ALT: 54 U/L (ref 17–63)
AST: 30 U/L (ref 15–41)
Alkaline Phosphatase: 60 U/L (ref 38–126)
Anion gap: 8 (ref 5–15)
BILIRUBIN TOTAL: 0.3 mg/dL (ref 0.3–1.2)
BUN: 15 mg/dL (ref 6–20)
CO2: 27 mmol/L (ref 22–32)
Calcium: 8.1 mg/dL — ABNORMAL LOW (ref 8.9–10.3)
Chloride: 103 mmol/L (ref 101–111)
Creatinine, Ser: 0.99 mg/dL (ref 0.61–1.24)
GFR calc Af Amer: >60 mL/min (ref >60)
GFR calc non Af Amer: >60 mL/min (ref >60)
GLUCOSE: 101 mg/dL — AB (ref 65–99)
Potassium: 3.7 mmol/L (ref 3.5–5.1)
Sodium: 138 mmol/L (ref 135–145)
Total Protein: 6.2 g/dL — ABNORMAL LOW (ref 6.5–8.1)

## 2015-02-27 LAB — CBC WITH DIFFERENTIAL/PLATELET
BASOS ABS: 0 10*3/uL (ref 0.0–0.1)
Basophils Relative: 0 % (ref 0–1)
EOS PCT: 0 % (ref 0–5)
Eosinophils Absolute: 0.1 10*3/uL (ref 0.0–0.7)
HCT: 37.5 % — ABNORMAL LOW (ref 39.0–52.0)
Hemoglobin: 12.4 g/dL — ABNORMAL LOW (ref 13.0–17.0)
Lymphocytes Relative: 15 % (ref 12–46)
Lymphs Abs: 1.8 10*3/uL (ref 0.7–4.0)
MCH: 29.6 pg (ref 26.0–34.0)
MCHC: 33.1 g/dL (ref 30.0–36.0)
MCV: 89.5 fL (ref 78.0–100.0)
Monocytes Absolute: 0.8 10*3/uL (ref 0.1–1.0)
Monocytes Relative: 7 % (ref 3–12)
NEUTROS PCT: 78 % — AB (ref 43–77)
Neutro Abs: 9.5 10*3/uL — ABNORMAL HIGH (ref 1.7–7.7)
PLATELETS: 445 10*3/uL — AB (ref 150–400)
RBC: 4.19 MIL/uL — ABNORMAL LOW (ref 4.22–5.81)
RDW: 14.6 % (ref 11.5–15.5)
WBC: 12.2 10*3/uL — ABNORMAL HIGH (ref 4.0–10.5)

## 2015-02-27 LAB — RESPIRATORY VIRUS PANEL
Adenovirus: NEGATIVE
Influenza A: NEGATIVE
Influenza B: NEGATIVE
Metapneumovirus: NEGATIVE
Parainfluenza 1: NEGATIVE
Parainfluenza 2: NEGATIVE
Parainfluenza 3: NEGATIVE
RESPIRATORY SYNCYTIAL VIRUS B: NEGATIVE
RHINOVIRUS: NEGATIVE
Respiratory Syncytial Virus A: NEGATIVE

## 2015-02-27 LAB — MAGNESIUM: MAGNESIUM: 2.4 mg/dL (ref 1.7–2.4)

## 2015-02-27 MED ORDER — LEVOFLOXACIN 750 MG PO TABS
750.0000 mg | ORAL_TABLET | ORAL | Status: DC
  Administered 2015-02-27: 750 mg via ORAL
  Filled 2015-02-27 (×2): qty 1

## 2015-02-27 MED ORDER — LEVOFLOXACIN 750 MG PO TABS: 750.0000 mg | ORAL_TABLET | Freq: Every day | ORAL | Status: DC

## 2015-02-27 NOTE — Progress Notes (Signed)
[place] TEAM 1 - Stepdown/ICU TEAM Progress Note  [name] [name] ZOX:[id number] DOB: [birth year] DOA: 02/23/2015 PCP: Lilia ArgueKAPLAN,KRISTEN, PA-C  Admit HPI / Brief Narrative: [name] is a 54 y.o.WM PMHx anxiety, hypertension, HLD  has been having productive cough nausea vomiting with shortness of breath over the last 4-5 days. 2 days ago patient's primary care physician had prescribed amoxicillin for possible pneumonia despite taking which patient was still short of breath with productive cough. Patient states he has had multiple episodes of nausea and vomiting. Denies any abdominal pain or diarrhea. In the ER patient was found to be hypoxic with chest x-ray showing left-sided infiltrates and febrile. Patient has been admitted for acute respiratory failure with sepsis secondary to pneumonia. Patient denies any recent travel or sick contacts.    HPI/Subjective: 5/ 22 A/O 4, negative N/V negative CP, negative DOE, sitting comfortably in chair.    Assessment/Plan: Acute hypoxic respiratory failure with hypoxia/Sepsis from CAP  -Continue current antibiotics  -DC Solu-Medrol  -Continue Mucinex DM  BID -Sputum not obtainable -DC ceftriaxone and Zithromax for CAP start levofloxacin 750 mg daily to complete 7 day course of antibiotic. -Influenza panel negative discontinue droplet protection -Ambulatory SPO2 in the a.m. prior to discharge  Elevated LFTs  -Most likely multifactorial to include fatty liver, and shock liver from hypoxia.  -Ultrasound; fatty liver see results below  -Liver enzymes normalized -Acute hepatitis panel negative  HTN -Continue lisinopril 10 mg daily  Hyponatremia -Resolved   Hypokalemia  -Resolved, continue to monitor closely     Code Status: FULL Family Communication: no family present at time of exam Disposition Plan: DC in a.m.    Consultants: NA  Procedure/Significant Events: 5/19 echocardiogram pending 5/19 abdominal ultrasound;No  gallstones.- Mild thickening gallbladder wall w/o sonographic Murphy's sign. -Normal CBD. -Diffuse increased echogenicity of the liver suspicious for fatty infiltration. 5/20 Echogardiogram;- LVEF= 50%- 55%.   5/19 RLE venous duplex;Negative for DVT or SVT   Culture 5/18 influenza A/B/H1N1 negative 5/18 HIV negative 5/18 Legionella Urine Ag Negative 5/18 Strep Pneumo Ag Negative 5/19 respiratory virus panel not collected 5/19 sputum not obtainable 5/19 acute hepatitis panel negative   Antibiotics: Azithromycin 5/19>> stopped 5/22 Ceftriaxone 5/19>> stopped 5/22 Levofloxacin 5/22>>   DVT prophylaxis: Lovenox   Devices    LINES / TUBES:      Continuous Infusions: . sodium chloride 75 mL/hr at 02/27/15 1746    Objective: VITAL SIGNS: Temp: 97.6 F (36.4 C) (05/22 1517) Temp Source: Oral (05/22 1517) BP: 108/69 mmHg (05/22 1517) Pulse Rate: 70 (05/22 1517) SPO2; FIO2:   Intake/Output Summary (Last 24 hours) at 02/27/15 1757 Last data filed at 02/27/15 1400  Gross per 24 hour  Intake   2565 ml  Output    700 ml  Net   1865 ml     Exam: General: A/O 4, acute respiratory distress resolved. Eyes: Negative retinal hemorrhage, pupils equal react to light and accommodation ENT: Negative Runny nose, negative ear pain, negative tinnitus, negative gingival bleeding,  Neck: Negative scars, masses, torticollis, lymphadenopathy, JVD Lungs: clear to auscultation bilateral, negative wheezes or crackles Cardiovascular: Regular rhythm and rate without murmur gallop or rub normal S1 and S2 Abdomen: Morbidly obese, Nontender,soft, bowel sounds positive, no rebound, no ascites, no appreciable mass Extremities: No significant cyanosis, clubbing, or edema bilateral lower extremities Psychiatric: Negative depression, negative anxiety, negative fatigue, negative mania  Neurologic: Cranial nerves II through XII intact, all extremities muscle strength 5/5,negative  dysarthria, negative expressive aphasia, negative receptive aphasia.  Data Reviewed: Basic Metabolic Panel:  Recent Labs Lab 02/23/15 1902 02/23/15 2255 02/24/15 0309 02/25/15 0235 02/26/15 0237 02/27/15 0313  NA 128*  --  132* 133* 138 138  K 3.3*  --  3.6 3.5 3.6 3.7  CL 90*  --  95* 100* 102 103  CO2 24  --  GLUCOSE 128*  --  159* 145* 113* 101*  BUN 14  --  CREATININE 1.17 1.22 1.11 0.83 0.93 0.99  CALCIUM 8.8*  --  7.9* 7.9* 8.4* 8.1*  MG  --  2.1  --  2.5* 2.3 2.4   Liver Function Tests:  Recent Labs Lab 02/23/15 1902 02/24/15 0309 02/25/15 0235 02/26/15 0237 02/27/15 0313  AST 116* 105* 70* 42* 30  ALT 82* 81* 76* 63 54  ALKPHOS 76 71 75 66 60  BILITOT 1.3* 0.9 0.7 0.5 0.3  PROT 7.7 6.7 6.4* 6.1* 6.2*  ALBUMIN 3.0* 2.5* 2.4* 2.3* 2.3*   No results for input(s): LIPASE, AMYLASE in the last 168 hours. No results for input(s): AMMONIA in the last 168 hours. CBC:  Recent Labs Lab 02/23/15 1902 02/23/15 2255 02/24/15 0309 02/25/15 0235 02/26/15 0237 02/27/15 0313  WBC 19.3* 19.0* 18.1* 15.5* 15.3* 12.2*  NEUTROABS 16.6*  --  15.4* 14.0* 12.0* 9.5*  HGB 15.2 13.4 13.4 12.9* 13.3 12.4*  HCT 42.4 38.1* 38.6* 37.4* 39.5 37.5*  MCV 84.6 85.2 86.0 86.6 88.2 89.5  PLT 264 248 246 296 375 445*   Cardiac Enzymes: No results for input(s): CKTOTAL, CKMB, CKMBINDEX, TROPONINI in the last 168 hours. BNP (last 3 results)  Recent Labs  02/23/15 1902  BNP 47.9    ProBNP (last 3 results) No results for input(s): PROBNP in the last 8760 hours.  CBG: No results for input(s): GLUCAP in the last 168 hours.  Recent Results (from the past 240 hour(s))  Blood Culture (routine x 2)     Status: None (Preliminary result)   Collection Time: 02/23/15  8:18 PM  Result Value Ref Range Status   Specimen Description BLOOD RIGHT ARM  Final   Special Requests BOTTLES DRAWN AEROBIC AND ANAEROBIC 5CC  Final   Culture   Final            BLOOD CULTURE RECEIVED NO GROWTH TO DATE CULTURE WILL BE HELD FOR 5 DAYS BEFORE ISSUING A FINAL NEGATIVE REPORT Performed at [facility]    Report Status PENDING  Incomplete  Blood Culture (routine x 2)     Status: None (Preliminary result)   Collection Time: 02/23/15  8:26 PM  Result Value Ref Range Status   Specimen Description BLOOD LEFT ARM  Final   Special Requests BOTTLES DRAWN AEROBIC AND ANAEROBIC 5CC  Final   Culture   Final           BLOOD CULTURE RECEIVED NO GROWTH TO DATE CULTURE WILL BE HELD FOR 5 DAYS BEFORE ISSUING A FINAL NEGATIVE REPORT Performed at [facility]    Report Status PENDING  Incomplete  MRSA PCR Screening     Status: None   Collection Time: 02/23/15 11:01 PM  Result Value Ref Range Status   MRSA by PCR NEGATIVE NEGATIVE Final    Comment:        The GeneXpert MRSA Assay (FDA approved for NASAL specimens only), is one component of a comprehensive MRSA colonization surveillance program. It is not intended to diagnose MRSA infection nor to guide or  monitor treatment for MRSA infections.   Respiratory virus panel     Status: None   Collection Time: 02/23/15 11:01 PM  Result Value Ref Range Status   Respiratory Syncytial Virus A Negative Negative Final   Respiratory Syncytial Virus B Negative Negative Final   Influenza A Negative Negative Final   Influenza B Negative Negative Final   Parainfluenza 1 Negative Negative Final   Parainfluenza 2 Negative Negative Final   Parainfluenza 3 Negative Negative Final   Metapneumovirus Negative Negative Final   Rhinovirus Negative Negative Final   Adenovirus Negative Negative Final    Comment: (NOTE) Performed At: [facility] [address] [place], [place] [id number] Mila Homer MD WU:[id number]      Studies:  Recent x-ray studies have been reviewed in detail by the Attending Physician  Scheduled Meds:  Scheduled Meds: . cefTRIAXone (ROCEPHIN)  IV  1 g Intravenous Q24H   . dextromethorphan-guaiFENesin  1 tablet Oral BID  . enoxaparin (LOVENOX) injection  40 mg Subcutaneous Q24H  . FLUoxetine  20 mg Oral Daily  . lisinopril  10 mg Oral Daily  . methylPREDNISolone (SOLU-MEDROL) injection  30 mg Intravenous Q24H  . polyethylene glycol  17 g Oral Daily  . simvastatin  20 mg Oral q1800    Time spent on care of this patient: 40 mins   WOODS, Roselind Messier , MD  [facility] Office  [phone] Pager [phone]  On-Call/Text Page:      [email or web address]      password [email or web address]  If 7PM-7AM, please contact night-coverage [email or web address] Password [facility] 02/27/2015, 5:57 PM   LOS: 4 days   Care during the described time interval was provided by me .  I have reviewed this patient's available data, including medical history, events of note, physical examination, radiology studies and test results as part of my evaluation  Carolyne Littles, MD [phone] Pager

## 2015-02-27 NOTE — Progress Notes (Signed)
[name] SalvageDonnie [name] is a 54 y.o. male patient who [facility]  [facility], alert  & orientated  X 3, Full Code, VSS - Blood pressure 127/82, pulse 63, temperature 98.1 F (36.7 C), temperature source Oral, resp. rate 20, height 5' 8.5" (1.74 m), weight 120 kg (264 lb 8.8 oz), SpO2 97 %.RA , no c/o shortness of breath, no c/o chest pain, no distress noted.   IV site WDL: with a transparent dsg that's clean dry and intact.  Allergies:   Allergies  Allergen Reactions  . Tamiflu [Oseltamivir Phosphate] Itching and Other (See Comments)    Burning sensation and tingling per patient     Past Medical History  Diagnosis Date  . Hypertension   . Back pain     Pt orientation to unit, room and routine. SR up x 2, fall risk assessment complete with Patient and family verbalizing understanding of risks associated with falls. Pt verbalizes an understanding of how to use the call bell and to call for help before getting out of bed.    Will cont to monitor and assist as needed.  Elisha PonderFaucette, [name] Chappelle Nicole, RN 02/27/2015 9:31 PM

## 2015-02-27 NOTE — Progress Notes (Signed)
Received patient to room 31 from 3S via bed, report gotten from PerryWhittney.  SR up, call bell in reach, IVF infusing without problems, site clear. Patient denies pain or problems, wife at bedside. Oriented to room, call bell, bed controls, patient guide book and all orders with verbal understanding. No problems noted or voiced at this time, will monitor.

## 2015-02-27 NOTE — Progress Notes (Signed)
Utilization review completed.  

## 2015-02-28 LAB — CBC WITH DIFFERENTIAL/PLATELET
BASOS ABS: 0 10*3/uL (ref 0.0–0.1)
Basophils Relative: 0 % (ref 0–1)
EOS PCT: 1 % (ref 0–5)
Eosinophils Absolute: 0.1 10*3/uL (ref 0.0–0.7)
HCT: 40 % (ref 39.0–52.0)
Hemoglobin: 13.5 g/dL (ref 13.0–17.0)
LYMPHS ABS: 2.4 10*3/uL (ref 0.7–4.0)
Lymphocytes Relative: 18 % (ref 12–46)
MCH: 30 pg (ref 26.0–34.0)
MCHC: 33.8 g/dL (ref 30.0–36.0)
MCV: 88.9 fL (ref 78.0–100.0)
Monocytes Absolute: 0.8 10*3/uL (ref 0.1–1.0)
Monocytes Relative: 6 % (ref 3–12)
Neutro Abs: 9.7 10*3/uL — ABNORMAL HIGH (ref 1.7–7.7)
Neutrophils Relative %: 75 % (ref 43–77)
Platelets: 490 10*3/uL — ABNORMAL HIGH (ref 150–400)
RBC: 4.5 MIL/uL (ref 4.22–5.81)
RDW: 14.4 % (ref 11.5–15.5)
WBC: 12.9 10*3/uL — ABNORMAL HIGH (ref 4.0–10.5)

## 2015-02-28 LAB — COMPREHENSIVE METABOLIC PANEL
ALT: 57 U/L (ref 17–63)
ANION GAP: 7 (ref 5–15)
AST: 39 U/L (ref 15–41)
Albumin: 2.5 g/dL — ABNORMAL LOW (ref 3.5–5.0)
Alkaline Phosphatase: 57 U/L (ref 38–126)
BUN: 14 mg/dL (ref 6–20)
CALCIUM: 8.2 mg/dL — AB (ref 8.9–10.3)
CO2: 27 mmol/L (ref 22–32)
CREATININE: 0.99 mg/dL (ref 0.61–1.24)
Chloride: 103 mmol/L (ref 101–111)
GFR calc non Af Amer: >60 mL/min (ref >60)
GLUCOSE: 99 mg/dL (ref 65–99)
POTASSIUM: 4 mmol/L (ref 3.5–5.1)
SODIUM: 137 mmol/L (ref 135–145)
Total Bilirubin: 0.5 mg/dL (ref 0.3–1.2)
Total Protein: 6.5 g/dL (ref 6.5–8.1)

## 2015-02-28 LAB — MAGNESIUM: Magnesium: 2.3 mg/dL (ref 1.7–2.4)

## 2015-02-28 MED ORDER — LEVOFLOXACIN 750 MG PO TABS: 750.0000 mg | ORAL_TABLET | ORAL | Status: DC

## 2015-02-28 MED ORDER — DM-GUAIFENESIN ER 30-600 MG PO TB12: 2.0000 | ORAL_TABLET | Freq: Two times a day (BID) | ORAL | Status: DC

## 2015-02-28 MED ORDER — ALBUTEROL SULFATE HFA 108 (90 BASE) MCG/ACT IN AERS: 2.0000 | INHALATION_SPRAY | Freq: Four times a day (QID) | RESPIRATORY_TRACT | Status: AC | PRN

## 2015-02-28 NOTE — Care Management Note (Signed)
Case Management Note  Patient Details  Name: [name] [name] MRN: [id number] Date of Birth: Sep 10, 1961  Subjective/Objective:   Patient lives with spouse, pta indep.  NCM will cont to follow for dc needs.                 Action/Plan:   Expected Discharge Date:                  Expected Discharge Plan:  Home/Self Care  In-House Referral:     Discharge planning Services  CM Consult  Post Acute Care Choice:    Choice offered to:     DME Arranged:    DME Agency:     HH Arranged:    HH Agency:     Status of Service:  In process, will continue to follow  Medicare Important Message Given:  Yes Date Medicare IM Given:  02/28/15 Medicare IM give by:  Letha Capeeborah Eduin Friedel RN Date Additional Medicare IM Given:    Additional Medicare Important Message give by:     If discussed at Long Length of Stay Meetings, dates discussed:    Additional Comments:  Leone Havenaylor, Negin Hegg Clinton, RN 02/28/2015, 4:06 PM

## 2015-02-28 NOTE — Progress Notes (Signed)
Pt discharging to home with family, IV dc'd, Vitals stable, Pt on RA,A7Ox4. Discharge education reviewed and all questions answered. 02/28/2015 6:15 PM [name] [name]

## 2015-02-28 NOTE — Discharge Summary (Signed)
Physician Discharge Summary  [name] [name] ZOX:[id number] DOB: [birth year] DOA: 02/23/2015  PCP: Lilia Argue  Admit date: 02/23/2015 Discharge date: 02/28/2015  Time spent: 65 minutes  Recommendations for Outpatient Follow-up:  1. Follow-up with KAPLAN,KRISTEN, PA-C in 1 week.  Discharge Diagnoses:  Principal Problem:   Acute respiratory failure with hypoxia Active Problems:   Community acquired pneumonia   Sepsis   Elevated LFTs   Nausea & vomiting   Hyponatremia   Hypertension   Acute respiratory failure   Sepsis due to pneumonia   Hypokalemia   Discharge Condition: Stable and improved  Diet recommendation: Regular  Filed Weights   02/23/15 2034 02/23/15 2217 02/27/15 2125  Weight: 119.75 kg (264 lb) 120 kg (264 lb 8.8 oz) 121.882 kg (268 lb 11.2 oz)    History of present illness:  [name] [name] is a 54 y.o.WM PMHx anxiety, hypertension, HLD  Who had been having productive cough nausea vomiting with shortness of breath over the last 4-5 days prior to admission. 2 days prior to admission, patient's primary care physician had prescribed amoxicillin for possible pneumonia despite taking which patient was still short of breath with productive cough. Patient stated he had multiple episodes of nausea and vomiting. Denied any abdominal pain or diarrhea. In the ER patient was found to be hypoxic with chest x-ray showing left-sided infiltrates and febrile. Patient had been admitted for acute respiratory failure with sepsis secondary to pneumonia. Patient denied any recent travel or sick contacts  Hospital Course:  #1 Acute hypoxic respiratory failure secondary to community acquired pneumonia. Patient was initially admitted to the step down unit where he was placed empirically on IV azithromycin and IV Rocephin, nebulizer treatments, IV steriods, Mucinex. Patient was noted to have a pneumonia per chest x-ray on day of admission. Sputum was ordered however was unattainable.  Influenza PCR was also obtained which came back negative. Patient was monitored. Patient improved clinically on a daily basis. Patient was subsequently transferred to the regular floor. Patient was subsequently transitioned to oral Levaquin which she tolerated. Patient hypoxia improved such that by day of discharge patient was sent in greater than 90% on room air and on ambulation. Patient be discharged home to complete a course of antibiotics therapy. Patient will be discharged in stable and improved condition.  #2 elevated LFTs Patient on admission was noted to have elevated LFTs which was felt to be multifactorial secondary to fatty liver and shock liver from hypoxia. Abdominal ultrasound which was done was negative for acute cholecystitis. Showed diffuse echogenicity consistent with fatty infiltration of the liver. Patient's LFTs were monitored and followed and trended down. Acute hepatitis panel which was obtained was negative. Patient's LFTs had normalized by day of discharge.  #3 hypertension Patient was continued on lisinopril 10 mg daily.  #4 hyponatremia Patient was noted to be hyponatremic on admission. Patient was hydrated with IV fluids with resolution of hyponatremia.  #5 hypokalemia During the hospitalization patient was noted to be hypokalemic. Likely secondary to GI losses. Patient's potassium was repleted and hypokalemia had resolved by day of discharge. Patient was discharged in stable and improved condition.  Procedures: 5/19 echocardiogram pending 5/19 abdominal ultrasound;No gallstones.- Mild thickening gallbladder wall w/o sonographic Murphy's sign. -Normal CBD. -Diffuse increased echogenicity of the liver suspicious for fatty infiltration. 5/20 Echogardiogram;- LVEF= 50%- 55%.  5/19 RLE venous duplex;Negative for DVT or SVT   Culture 5/18 influenza A/B/H1N1 negative 5/18 HIV negative 5/18 Legionella Urine Ag Negative 5/18 Strep Pneumo Ag Negative 5/19 respiratory  virus panel not collected 5/19 sputum not obtainable 5/19 acute hepatitis panel negative  Consultations:  None  Discharge Exam: Filed Vitals:   02/28/15 1333  BP: 138/68  Pulse: 65  Temp: 98.1 F (36.7 C)  Resp:     General: NAD Cardiovascular: RRR Respiratory: CTAB  Discharge Instructions   Discharge Instructions    Diet general    Complete by:  As directed      Discharge instructions    Complete by:  As directed   Follow up with KAPLAN,KRISTEN, PA-C in 1 week.     Increase activity slowly    Complete by:  As directed           Current Discharge Medication List    START taking these medications   Details  albuterol (PROVENTIL HFA;VENTOLIN HFA) 108 (90 BASE) MCG/ACT inhaler Inhale 2 puffs into the lungs every 6 (six) hours as needed for wheezing or shortness of breath. Use 2 puffs 3 times daily x 4 days, then use every 6 hours as needed. Qty: 1 Inhaler, Refills: 0    dextromethorphan-guaiFENesin (MUCINEX DM) 30-600 MG per 12 hr tablet Take 2 tablets by mouth 2 (two) times daily. Take for 4 days then stop. Qty: 16 tablet, Refills: 0    levofloxacin (LEVAQUIN) 750 MG tablet Take 1 tablet (750 mg total) by mouth daily. Take for 4 days then stop. Qty: 4 tablet, Refills: 0      CONTINUE these medications which have NOT CHANGED   Details  acetaminophen (TYLENOL) 500 MG tablet Take 500 mg by mouth every 6 (six) hours as needed for fever.    benzonatate (TESSALON) 100 MG capsule Take 100-200 mg by mouth 3 (three) times daily as needed for cough.    FLUoxetine (PROZAC) 20 MG capsule Take 20 mg by mouth daily.    guaifenesin (ROBITUSSIN) 100 MG/5ML syrup Take 200 mg by mouth 3 (three) times daily as needed for cough.    lisinopril (PRINIVIL,ZESTRIL) 10 MG tablet Take 10 mg by mouth daily.    LORazepam (ATIVAN) 0.5 MG tablet Take 0.5 mg by mouth every 4 (four) hours as needed for anxiety.    simvastatin (ZOCOR) 20 MG tablet Take 20 mg by mouth daily at 6 PM.       STOP taking these medications     amoxicillin (AMOXIL) 875 MG tablet      guaiFENesin (MUCINEX) 600 MG 12 hr tablet        Allergies  Allergen Reactions  . Tamiflu [Oseltamivir Phosphate] Itching and Other (See Comments)    Burning sensation and tingling per patient   Follow-up Information    Follow up with KAPLAN,KRISTEN, PA-C. Schedule an appointment as soon as possible for a visit in 1 week.   Specialty:  Family Medicine   Contact information:   [address] [place] [id number] [phone]        The results of significant diagnostics from this hospitalization (including imaging, microbiology, ancillary and laboratory) are listed below for reference.    Significant Diagnostic Studies: Dg Chest 2 View  02/23/2015   CLINICAL DATA:  Chest pain, shortness of breath, cough and fever since 02/20/2015.  EXAM: CHEST  2 VIEW  COMPARISON:  PA and lateral chest 12/24/2008.  FINDINGS: Extensive airspace disease is seen the left chest, worst in the lingula. The right lung appears clear. Heart size is normal. No pneumothorax or pleural effusion.  IMPRESSION: Extensive left side airspace disease most consistent with pneumonia. Recommend followup films  to clearing.   Electronically Signed   By: Drusilla Kanner M.D.   On: 02/23/2015 19:44   US Abdomen Complete  02/24/2015   CLINICAL DATA:  Elevated LFTs  EXAM: ULTRASOUND ABDOMEN COMPLETE  COMPARISON:  None.  FINDINGS: Gallbladder: No gallstones are noted within gallbladder. Mild thickening of gallbladder wall up to 4 mm. No sonographic Murphy's sign. No pericholecystic fluid.  Common bile duct: Diameter: 5 mm in diameter within normal limits.  Liver: No focal lesion identified. Diffuse increased echogenicity of the liver probable due to fatty infiltration.  IVC: Not well seen due to bowel gas  Pancreas: Not well seen due to bowel gas  Spleen: Size and appearance within normal limits. Measures 8.7 cm in length.  Right Kidney: Length: 12.6  cm. Echogenicity within normal limits. No mass or hydronephrosis visualized.  Left Kidney: Length: 14.2 cm. Echogenicity within normal limits. No mass or hydronephrosis visualized.  Abdominal aorta: No aneurysm visualized. Measures up to 2.9 cm in diameter.  Other findings: Suboptimal exam due to bowel gas and patient's large body habitus.  IMPRESSION: 1. No gallstones are noted within gallbladder. Mild thickening of gallbladder wall without sonographic Murphy's sign. Normal CBD. 2. Diffuse increased echogenicity of the liver suspicious for fatty infiltration. No focal hepatic mass. 3. No hydronephrosis. 4. No aortic aneurysm.   Electronically Signed   By: Natasha Mead M.D.   On: 02/24/2015 08:52    Microbiology: Recent Results (from the past 240 hour(s))  Blood Culture (routine x 2)     Status: None (Preliminary result)   Collection Time: 02/23/15  8:18 PM  Result Value Ref Range Status   Specimen Description BLOOD RIGHT ARM  Final   Special Requests BOTTLES DRAWN AEROBIC AND ANAEROBIC 5CC  Final   Culture   Final           BLOOD CULTURE RECEIVED NO GROWTH TO DATE CULTURE WILL BE HELD FOR 5 DAYS BEFORE ISSUING A FINAL NEGATIVE REPORT Performed at [facility]    Report Status PENDING  Incomplete  Blood Culture (routine x 2)     Status: None (Preliminary result)   Collection Time: 02/23/15  8:26 PM  Result Value Ref Range Status   Specimen Description BLOOD LEFT ARM  Final   Special Requests BOTTLES DRAWN AEROBIC AND ANAEROBIC 5CC  Final   Culture   Final           BLOOD CULTURE RECEIVED NO GROWTH TO DATE CULTURE WILL BE HELD FOR 5 DAYS BEFORE ISSUING A FINAL NEGATIVE REPORT Performed at [facility]    Report Status PENDING  Incomplete  MRSA PCR Screening     Status: None   Collection Time: 02/23/15 11:01 PM  Result Value Ref Range Status   MRSA by PCR NEGATIVE NEGATIVE Final    Comment:        The GeneXpert MRSA Assay (FDA approved for NASAL specimens only), is one  component of a comprehensive MRSA colonization surveillance program. It is not intended to diagnose MRSA infection nor to guide or monitor treatment for MRSA infections.   Respiratory virus panel     Status: None   Collection Time: 02/23/15 11:01 PM  Result Value Ref Range Status   Respiratory Syncytial Virus A Negative Negative Final   Respiratory Syncytial Virus B Negative Negative Final   Influenza A Negative Negative Final   Influenza B Negative Negative Final   Parainfluenza 1 Negative Negative Final   Parainfluenza 2 Negative Negative Final  Parainfluenza 3 Negative Negative Final   Metapneumovirus Negative Negative Final   Rhinovirus Negative Negative Final   Adenovirus Negative Negative Final    Comment: (NOTE) Performed At: [facility] [address] [place], [place] [id number] Mila Homer MD WU:[id number]      Labs: Basic Metabolic Panel:  Recent Labs Lab 02/23/15 2255 02/24/15 0309 02/25/15 0235 02/26/15 0237 02/27/15 0313 02/28/15 0537  NA  --  132* 133* 138 138 137  K  --  3.6 3.5 3.6 3.7 4.0  CL  --  95* 100* 102 103 103  CO2  --  GLUCOSE  --  159* 145* 113* 101* 99  BUN  --  CREATININE 1.22 1.11 0.83 0.93 0.99 0.99  CALCIUM  --  7.9* 7.9* 8.4* 8.1* 8.2*  MG 2.1  --  2.5* 2.3 2.4 2.3   Liver Function Tests:  Recent Labs Lab 02/24/15 0309 02/25/15 0235 02/26/15 0237 02/27/15 0313 02/28/15 0537  AST 105* 70* 42* 30 39  ALT 81* 76* 63 54 57  ALKPHOS 71 75 66 60 57  BILITOT 0.9 0.7 0.5 0.3 0.5  PROT 6.7 6.4* 6.1* 6.2* 6.5  ALBUMIN 2.5* 2.4* 2.3* 2.3* 2.5*   No results for input(s): LIPASE, AMYLASE in the last 168 hours. No results for input(s): AMMONIA in the last 168 hours. CBC:  Recent Labs Lab 02/24/15 0309 02/25/15 0235 02/26/15 0237 02/27/15 0313 02/28/15 0537  WBC 18.1* 15.5* 15.3* 12.2* 12.9*  NEUTROABS 15.4* 14.0* 12.0* 9.5* 9.7*  HGB 13.4 12.9* 13.3 12.4* 13.5  HCT 38.6*  37.4* 39.5 37.5* 40.0  MCV 86.0 86.6 88.2 89.5 88.9  PLT 246 296 375 445* 490*   Cardiac Enzymes: No results for input(s): CKTOTAL, CKMB, CKMBINDEX, TROPONINI in the last 168 hours. BNP: BNP (last 3 results)  Recent Labs  02/23/15 1902  BNP 47.9    ProBNP (last 3 results) No results for input(s): PROBNP in the last 8760 hours.  CBG: No results for input(s): GLUCAP in the last 168 hours.     SignedRamiro Harvest MD [facility] 02/28/2015, 6:03 PM

## 2015-03-02 LAB — CULTURE, BLOOD (ROUTINE X 2)
CULTURE: NO GROWTH
Culture: NO GROWTH

## 2015-09-10 ENCOUNTER — Encounter (HOSPITAL_COMMUNITY)
Admission: EM | Disposition: A | Discharge status: Home / Self Care | Payer: Self-pay | Source: Home / Self Care | Attending: Physician Assistant

## 2015-09-10 ENCOUNTER — Emergency Department (HOSPITAL_COMMUNITY): Payer: Medicare Other | Admitting: Anesthesiology

## 2015-09-10 ENCOUNTER — Encounter (HOSPITAL_COMMUNITY): Payer: Self-pay | Admitting: Emergency Medicine

## 2015-09-10 ENCOUNTER — Emergency Department (HOSPITAL_COMMUNITY): Payer: Medicare Other

## 2015-09-10 ENCOUNTER — Ambulatory Visit (HOSPITAL_COMMUNITY)
Admission: EM | Admit: 2015-09-10 | Discharge: 2015-09-10 | Disposition: A | Discharge status: Home / Self Care | Payer: Medicare Other | Attending: Physician Assistant | Admitting: Physician Assistant

## 2015-09-10 DIAGNOSIS — S62609B Fracture of unspecified phalanx of unspecified finger, initial encounter for open fracture: Secondary | ICD-10-CM

## 2015-09-10 DIAGNOSIS — I10 Essential (primary) hypertension: Secondary | ICD-10-CM | POA: Insufficient documentation

## 2015-09-10 DIAGNOSIS — S62637B Displaced fracture of distal phalanx of left little finger, initial encounter for open fracture: Secondary | ICD-10-CM | POA: Diagnosis not present

## 2015-09-10 DIAGNOSIS — Z23 Encounter for immunization: Secondary | ICD-10-CM | POA: Insufficient documentation

## 2015-09-10 DIAGNOSIS — Z79899 Other long term (current) drug therapy: Secondary | ICD-10-CM | POA: Diagnosis not present

## 2015-09-10 DIAGNOSIS — S66325A Laceration of extensor muscle, fascia and tendon of left ring finger at wrist and hand level, initial encounter: Secondary | ICD-10-CM | POA: Diagnosis not present

## 2015-09-10 DIAGNOSIS — W298XXA Contact with other powered powered hand tools and household machinery, initial encounter: Secondary | ICD-10-CM | POA: Diagnosis not present

## 2015-09-10 DIAGNOSIS — S62625B Displaced fracture of medial phalanx of left ring finger, initial encounter for open fracture: Secondary | ICD-10-CM | POA: Insufficient documentation

## 2015-09-10 DIAGNOSIS — Z6839 Body mass index (BMI) 39.0-39.9, adult: Secondary | ICD-10-CM | POA: Insufficient documentation

## 2015-09-10 DIAGNOSIS — S6992XA Unspecified injury of left wrist, hand and finger(s), initial encounter: Secondary | ICD-10-CM | POA: Diagnosis present

## 2015-09-10 HISTORY — PX: I & D EXTREMITY: SHX5045

## 2015-09-10 LAB — BASIC METABOLIC PANEL
Anion gap: 8 (ref 5–15)
BUN: 13 mg/dL (ref 6–20)
CHLORIDE: 104 mmol/L (ref 101–111)
CO2: 22 mmol/L (ref 22–32)
CREATININE: 0.8 mg/dL (ref 0.61–1.24)
Calcium: 9.1 mg/dL (ref 8.9–10.3)
GFR calc Af Amer: >60 mL/min (ref >60)
GFR calc non Af Amer: >60 mL/min (ref >60)
GLUCOSE: 115 mg/dL — AB (ref 65–99)
POTASSIUM: 4.1 mmol/L (ref 3.5–5.1)
SODIUM: 134 mmol/L — AB (ref 135–145)

## 2015-09-10 LAB — CBC WITH DIFFERENTIAL/PLATELET
Basophils Absolute: 0 10*3/uL (ref 0.0–0.1)
Basophils Relative: 0 %
EOS ABS: 0.1 10*3/uL (ref 0.0–0.7)
Eosinophils Relative: 1 %
HCT: 45.6 % (ref 39.0–52.0)
HEMOGLOBIN: 15.3 g/dL (ref 13.0–17.0)
LYMPHS ABS: 1.4 10*3/uL (ref 0.7–4.0)
LYMPHS PCT: 11 %
MCH: 29.9 pg (ref 26.0–34.0)
MCHC: 33.6 g/dL (ref 30.0–36.0)
MCV: 89.2 fL (ref 78.0–100.0)
Monocytes Absolute: 0.6 10*3/uL (ref 0.1–1.0)
Monocytes Relative: 5 %
NEUTROS PCT: 83 %
Neutro Abs: 10.5 10*3/uL — ABNORMAL HIGH (ref 1.7–7.7)
Platelets: 314 10*3/uL (ref 150–400)
RBC: 5.11 MIL/uL (ref 4.22–5.81)
RDW: 13.3 % (ref 11.5–15.5)
WBC: 12.5 10*3/uL — AB (ref 4.0–10.5)

## 2015-09-10 SURGERY — IRRIGATION AND DEBRIDEMENT EXTREMITY
Anesthesia: General | Site: Hand | Laterality: Left

## 2015-09-10 MED ORDER — ARTIFICIAL TEARS OP OINT
TOPICAL_OINTMENT | OPHTHALMIC | Status: AC
  Filled 2015-09-10: qty 3.5

## 2015-09-10 MED ORDER — SODIUM CHLORIDE 0.9 % IR SOLN
Status: DC | PRN
  Administered 2015-09-10: 1000 mL

## 2015-09-10 MED ORDER — LIDOCAINE HCL (CARDIAC) 20 MG/ML IV SOLN
INTRAVENOUS | Status: DC | PRN
  Administered 2015-09-10: 80 mg via INTRAVENOUS

## 2015-09-10 MED ORDER — CEFAZOLIN SODIUM-DEXTROSE 2-3 GM-% IV SOLR
INTRAVENOUS | Status: AC
  Filled 2015-09-10: qty 50

## 2015-09-10 MED ORDER — VECURONIUM BROMIDE 10 MG IV SOLR
INTRAVENOUS | Status: AC
  Filled 2015-09-10: qty 10

## 2015-09-10 MED ORDER — PROPOFOL 10 MG/ML IV BOLUS
INTRAVENOUS | Status: AC
  Filled 2015-09-10: qty 20

## 2015-09-10 MED ORDER — PHENYLEPHRINE HCL 10 MG/ML IJ SOLN
INTRAMUSCULAR | Status: DC | PRN
  Administered 2015-09-10: 80 ug via INTRAVENOUS

## 2015-09-10 MED ORDER — ONDANSETRON HCL 4 MG/2ML IJ SOLN: 4.0000 mg | Freq: Once | INTRAMUSCULAR | Status: DC

## 2015-09-10 MED ORDER — LACTATED RINGERS IV SOLN
INTRAVENOUS | Status: DC | PRN
  Administered 2015-09-10 (×2): via INTRAVENOUS

## 2015-09-10 MED ORDER — STERILE WATER FOR INJECTION IJ SOLN
INTRAMUSCULAR | Status: AC
  Filled 2015-09-10: qty 10

## 2015-09-10 MED ORDER — MIDAZOLAM HCL 2 MG/2ML IJ SOLN
INTRAMUSCULAR | Status: AC
  Filled 2015-09-10: qty 2

## 2015-09-10 MED ORDER — MIDAZOLAM HCL 5 MG/5ML IJ SOLN
INTRAMUSCULAR | Status: DC | PRN
  Administered 2015-09-10: 2 mg via INTRAVENOUS

## 2015-09-10 MED ORDER — MORPHINE SULFATE (PF) 4 MG/ML IV SOLN: 4.0000 mg | Freq: Once | INTRAVENOUS | Status: DC

## 2015-09-10 MED ORDER — LIDOCAINE HCL (CARDIAC) 20 MG/ML IV SOLN
INTRAVENOUS | Status: AC
  Filled 2015-09-10: qty 10

## 2015-09-10 MED ORDER — PROMETHAZINE HCL 25 MG/ML IJ SOLN: 6.2500 mg | INTRAMUSCULAR | Status: DC | PRN

## 2015-09-10 MED ORDER — NEOSTIGMINE METHYLSULFATE 10 MG/10ML IV SOLN
INTRAVENOUS | Status: AC
  Filled 2015-09-10: qty 1

## 2015-09-10 MED ORDER — BUPIVACAINE HCL (PF) 0.25 % IJ SOLN
INTRAMUSCULAR | Status: DC | PRN
  Administered 2015-09-10: 10 mL

## 2015-09-10 MED ORDER — PROPOFOL 10 MG/ML IV BOLUS
INTRAVENOUS | Status: DC | PRN
  Administered 2015-09-10: 200 mg via INTRAVENOUS

## 2015-09-10 MED ORDER — FENTANYL CITRATE (PF) 250 MCG/5ML IJ SOLN
INTRAMUSCULAR | Status: AC
  Filled 2015-09-10: qty 5

## 2015-09-10 MED ORDER — ROCURONIUM BROMIDE 50 MG/5ML IV SOLN
INTRAVENOUS | Status: AC
  Filled 2015-09-10: qty 2

## 2015-09-10 MED ORDER — PHENYLEPHRINE HCL 10 MG/ML IJ SOLN
INTRAMUSCULAR | Status: AC
  Filled 2015-09-10: qty 1

## 2015-09-10 MED ORDER — HYDROCODONE-ACETAMINOPHEN 5-325 MG PO TABS
1.0000 | ORAL_TABLET | Freq: Once | ORAL | Status: AC
  Administered 2015-09-10: 1 via ORAL
  Filled 2015-09-10: qty 1

## 2015-09-10 MED ORDER — ETOMIDATE 2 MG/ML IV SOLN
INTRAVENOUS | Status: AC
  Filled 2015-09-10: qty 10

## 2015-09-10 MED ORDER — FENTANYL CITRATE (PF) 250 MCG/5ML IJ SOLN
INTRAMUSCULAR | Status: DC | PRN
  Administered 2015-09-10: 50 ug via INTRAVENOUS
  Administered 2015-09-10: 100 ug via INTRAVENOUS
  Administered 2015-09-10 (×2): 50 ug via INTRAVENOUS

## 2015-09-10 MED ORDER — HYDROMORPHONE HCL 1 MG/ML IJ SOLN: 0.2500 mg | INTRAMUSCULAR | Status: DC | PRN

## 2015-09-10 MED ORDER — ONDANSETRON HCL 4 MG/2ML IJ SOLN
INTRAMUSCULAR | Status: AC
  Filled 2015-09-10: qty 4

## 2015-09-10 MED ORDER — TETANUS-DIPHTH-ACELL PERTUSSIS 5-2.5-18.5 LF-MCG/0.5 IM SUSP
0.5000 mL | Freq: Once | INTRAMUSCULAR | Status: AC
  Administered 2015-09-10: 0.5 mL via INTRAMUSCULAR
  Filled 2015-09-10: qty 0.5

## 2015-09-10 MED ORDER — BUPIVACAINE HCL (PF) 0.25 % IJ SOLN
INTRAMUSCULAR | Status: AC
  Filled 2015-09-10: qty 30

## 2015-09-10 MED ORDER — SUCCINYLCHOLINE CHLORIDE 20 MG/ML IJ SOLN
INTRAMUSCULAR | Status: AC
  Filled 2015-09-10: qty 2

## 2015-09-10 MED ORDER — EPHEDRINE SULFATE 50 MG/ML IJ SOLN
INTRAMUSCULAR | Status: AC
Start: 2015-09-10 — End: 2015-09-10
  Filled 2015-09-10: qty 1

## 2015-09-10 MED ORDER — CEFAZOLIN SODIUM-DEXTROSE 2-3 GM-% IV SOLR
2.0000 g | Freq: Once | INTRAVENOUS | Status: AC
  Administered 2015-09-10: 2 g via INTRAVENOUS

## 2015-09-10 MED ORDER — ONDANSETRON HCL 4 MG/2ML IJ SOLN
INTRAMUSCULAR | Status: DC | PRN
  Administered 2015-09-10: 80 mg via INTRAVENOUS

## 2015-09-10 MED ORDER — ONDANSETRON HCL 4 MG/2ML IJ SOLN
INTRAMUSCULAR | Status: AC
  Filled 2015-09-10: qty 2

## 2015-09-10 MED ORDER — GLYCOPYRROLATE 0.2 MG/ML IJ SOLN
INTRAMUSCULAR | Status: AC
  Filled 2015-09-10: qty 3

## 2015-09-10 MED ORDER — LACTATED RINGERS IV SOLN
INTRAVENOUS | Status: DC
  Administered 2015-09-10: 16:00:00 via INTRAVENOUS

## 2015-09-10 MED ORDER — SODIUM CHLORIDE 0.9 % IJ SOLN
INTRAMUSCULAR | Status: AC
  Filled 2015-09-10: qty 10

## 2015-09-10 SURGICAL SUPPLY — 43 items
BAG DECANTER FOR FLEXI CONT (MISCELLANEOUS) ×1 IMPLANT
BANDAGE ELASTIC 3 VELCRO ST LF (GAUZE/BANDAGES/DRESSINGS) IMPLANT
BANDAGE ELASTIC 4 VELCRO ST LF (GAUZE/BANDAGES/DRESSINGS) IMPLANT
BNDG COHESIVE 1X5 TAN STRL LF (GAUZE/BANDAGES/DRESSINGS) ×1 IMPLANT
BNDG GAUZE ELAST 4 BULKY (GAUZE/BANDAGES/DRESSINGS) IMPLANT
CORDS BIPOLAR (ELECTRODE) IMPLANT
CUFF TOURNIQUET SINGLE 18IN (TOURNIQUET CUFF) IMPLANT
DRAPE SURG 17X23 STRL (DRAPES) ×2 IMPLANT
ELECT REM PT RETURN 9FT ADLT (ELECTROSURGICAL)
ELECTRODE REM PT RTRN 9FT ADLT (ELECTROSURGICAL) IMPLANT
GAUZE PACKING IODOFORM 1/4X5 (PACKING) IMPLANT
GAUZE SPONGE 4X4 12PLY STRL (GAUZE/BANDAGES/DRESSINGS) ×2 IMPLANT
GAUZE XEROFORM 1X8 LF (GAUZE/BANDAGES/DRESSINGS) ×2 IMPLANT
GLOVE BIOGEL M STRL SZ7.5 (GLOVE) ×2 IMPLANT
GOWN STRL REUS W/ TWL LRG LVL3 (GOWN DISPOSABLE) ×2 IMPLANT
GOWN STRL REUS W/TWL LRG LVL3 (GOWN DISPOSABLE) ×4
HANDPIECE INTERPULSE COAX TIP (DISPOSABLE)
K-WIRE DBL 4X.28 (WIRE) ×2
KIT BASIN OR (CUSTOM PROCEDURE TRAY) ×2 IMPLANT
KIT ROOM TURNOVER OR (KITS) ×2 IMPLANT
KWIRE DBL 4X.28 (WIRE) IMPLANT
MANIFOLD NEPTUNE II (INSTRUMENTS) ×2 IMPLANT
NDL HYPO 25GX1X1/2 BEV (NEEDLE) IMPLANT
NEEDLE HYPO 25GX1X1/2 BEV (NEEDLE) IMPLANT
NS IRRIG 1000ML POUR BTL (IV SOLUTION) ×2 IMPLANT
PACK ORTHO EXTREMITY (CUSTOM PROCEDURE TRAY) ×2 IMPLANT
PAD ARMBOARD 7.5X6 YLW CONV (MISCELLANEOUS) ×4 IMPLANT
PAD CAST 4YDX4 CTTN HI CHSV (CAST SUPPLIES) IMPLANT
PADDING CAST COTTON 4X4 STRL (CAST SUPPLIES)
SET HNDPC FAN SPRY TIP SCT (DISPOSABLE) IMPLANT
SOAP 2 % CHG 4 OZ (WOUND CARE) ×2 IMPLANT
SPONGE LAP 18X18 X RAY DECT (DISPOSABLE) IMPLANT
SPONGE LAP 4X18 X RAY DECT (DISPOSABLE) ×2 IMPLANT
SUT ETHILON 5 0 PS 2 18 (SUTURE) ×2 IMPLANT
SUT VIC AB 4-0 P-3 18X BRD (SUTURE) IMPLANT
SUT VIC AB 4-0 P3 18 (SUTURE) ×2
SYR CONTROL 10ML LL (SYRINGE) IMPLANT
TOWEL OR 17X24 6PK STRL BLUE (TOWEL DISPOSABLE) ×2 IMPLANT
TOWEL OR 17X26 10 PK STRL BLUE (TOWEL DISPOSABLE) ×2 IMPLANT
TUBE ANAEROBIC SPECIMEN COL (MISCELLANEOUS) IMPLANT
TUBE CONNECTING 12X1/4 (SUCTIONS) ×2 IMPLANT
WATER STERILE IRR 1000ML POUR (IV SOLUTION) ×2 IMPLANT
YANKAUER SUCT BULB TIP NO VENT (SUCTIONS) ×2 IMPLANT

## 2015-09-10 NOTE — Discharge Instructions (Signed)
Discharge Instructions:  Keep your dressing clean, dry and in place until instructed to remove by Dr. Shatora Weatherbee.  If the dressing becomes dirty or wet call the office for instructions during business hours. Elevate the extremity to help with swelling, this will also help with any discomfort. Take your medication as prescribed. No lifting with the injured  extremity. If you feel that the dressing is too tight, you may loosen it, but keep it on; finger tips should be pink; if there is a concern, call the office. [phone] Ice may be used if the injury is a fracture, do not apply ice directly to the skin. Please call the office on the next business day after discharge to arrange a follow up appointment.  Call [phone] between the hours of 9am - 5pm M-Th or 9am - 1pm on Fri. For most hand injuries and/or conditions, you may return to work using the uninjured hand (one handed duty) within 24-72 hours.  A detailed note will be provided to you at your follow up appointment or may contact the office prior to your follow up.    

## 2015-09-10 NOTE — Anesthesia Postprocedure Evaluation (Signed)
Anesthesia Post Note  Patient: [name] [name]  Procedure(s) Performed: Procedure(s) (LRB): REPAIR LACERATIONS OF HAND (Left)  Patient location during evaluation: PACU Anesthesia Type: General Level of consciousness: sedated Pain management: pain level controlled Vital Signs Assessment: post-procedure vital signs reviewed and stable Respiratory status: spontaneous breathing and respiratory function stable Cardiovascular status: stable Anesthetic complications: no    Last Vitals:  Filed Vitals:   09/10/15 1755 09/10/15 1757  BP:  123/74  Pulse:  75  Temp: 36.5 C   Resp:  24    Last Pain:  Filed Vitals:   09/10/15 1810  PainSc: 2                  Anissa Abbs DANIEL

## 2015-09-10 NOTE — Anesthesia Preprocedure Evaluation (Addendum)
Anesthesia Evaluation  Patient identified by MRN, date of birth, ID band Patient awake    Reviewed: Allergy & Precautions, Patient's Chart, lab work & pertinent test results  History of Anesthesia Complications Negative for: history of anesthetic complications  Airway Mallampati: III  TM Distance: >3 FB Neck ROM: Full    Dental  (+) Teeth Intact, Dental Advisory Given   Pulmonary    Pulmonary exam normal        Cardiovascular hypertension, Pt. on medications Normal cardiovascular exam     Neuro/Psych negative neurological ROS  negative psych ROS   GI/Hepatic negative GI ROS, Neg liver ROS,   Endo/Other  Morbid obesity  Renal/GU negative Renal ROS     Musculoskeletal   Abdominal   Peds  Hematology   Anesthesia Other Findings   Reproductive/Obstetrics                          Anesthesia Physical Anesthesia Plan  ASA: III  Anesthesia Plan: General   Post-op Pain Management:    Induction: Intravenous  Airway Management Planned: Oral ETT  Additional Equipment:   Intra-op Plan:   Post-operative Plan: Extubation in OR  Informed Consent: I have reviewed the patients History and Physical, chart, labs and discussed the procedure including the risks, benefits and alternatives for the proposed anesthesia with the patient or authorized representative who has indicated his/her understanding and acceptance.   Dental advisory given  Plan Discussed with: CRNA, Anesthesiologist and Surgeon  Anesthesia Plan Comments:        Anesthesia Quick Evaluation

## 2015-09-10 NOTE — ED Notes (Signed)
Patient refused iv

## 2015-09-10 NOTE — H&P (Signed)
Reason for Consult:saw injury Referring Physician: ER  CC:I cut my fingers on a saw  HPI:  [name] [name] is an 54 y.o. right handed male who presents with lacerations of Lsf, Lrf, s/p saw injury.       .   Pain is rated at    /10 and is described as sharp.  Pain is constant.  Pain is made better by rest/immobilization, worse with motion.   Associated signs/symptoms:none Previous treatment:  n/a  Past Medical History  Diagnosis Date  . Hypertension   . Back pain     Past Surgical History  Procedure Laterality Date  . Back surgery      Family History  Problem Relation Age of Onset  . Hypertension Mother   . Diabetes Mellitus II Father   . CAD Father     Social History:  reports that he has never smoked. He does not have any smokeless tobacco history on file. He reports that he does not drink alcohol. His drug history is not on file.  Allergies:  Allergies  Allergen Reactions  . Morphine And Related     Anxious/restless  . Tamiflu [Oseltamivir Phosphate] Itching and Other (See Comments)    Burning sensation and tingling per patient    Medications: I have reviewed the patient's current medications.  Results for orders placed or performed during the hospital encounter of 09/10/15 (from the past 48 hour(s))  CBC with Differential     Status: Abnormal   Collection Time: 09/10/15  2:51 PM  Result Value Ref Range   WBC 12.5 (H) 4.0 - 10.5 K/uL   RBC 5.11 4.22 - 5.81 MIL/uL   Hemoglobin 15.3 13.0 - 17.0 g/dL   HCT 45.6 39.0 - 52.0 %   MCV 89.2 78.0 - 100.0 fL   MCH 29.9 26.0 - 34.0 pg   MCHC 33.6 30.0 - 36.0 g/dL   RDW 13.3 11.5 - 15.5 %   Platelets 314 150 - 400 K/uL   Neutrophils Relative % 83 %   Neutro Abs 10.5 (H) 1.7 - 7.7 K/uL   Lymphocytes Relative 11 %   Lymphs Abs 1.4 0.7 - 4.0 K/uL   Monocytes Relative 5 %   Monocytes Absolute 0.6 0.1 - 1.0 K/uL   Eosinophils Relative 1 %   Eosinophils Absolute 0.1 0.0 - 0.7 K/uL   Basophils Relative 0 %   Basophils  Absolute 0.0 0.0 - 0.1 K/uL  Basic metabolic panel     Status: Abnormal   Collection Time: 09/10/15  2:51 PM  Result Value Ref Range   Sodium 134 (L) 135 - 145 mmol/L   Potassium 4.1 3.5 - 5.1 mmol/L   Chloride 104 101 - 111 mmol/L   CO2 22 22 - 32 mmol/L   Glucose, Bld 115 (H) 65 - 99 mg/dL   BUN 13 6 - 20 mg/dL   Creatinine, Ser 0.80 0.61 - 1.24 mg/dL   Calcium 9.1 8.9 - 10.3 mg/dL   GFR calc non Af Amer >60 >60 mL/min   GFR calc Af Amer >60 >60 mL/min    Comment: (NOTE) The eGFR has been calculated using the CKD EPI equation. This calculation has not been validated in all clinical situations. eGFR's persistently <60 mL/min signify possible Chronic Kidney Disease.    Anion gap 8 5 - 15    Dg Hand Complete Left  09/10/2015  CLINICAL DATA:  Saw injury to the left hand EXAM: LEFT HAND - COMPLETE 3+ VIEW COMPARISON:  None. FINDINGS:  There is a nonarticular fracture of the distal tuft of the distal phalanx in the left fifth finger, with 2 mm volar displacement of the distal fracture fragment. There is a markedly comminuted middle phalanx fracture in the left fourth finger extending to the distal articular surface, with mild 3 mm ulnar/ dorsal displacement of the dominant distal butterfly fragment. No additional fracture. No dislocation. No suspicious focal osseous lesion. No radiopaque foreign body. IMPRESSION: 1. Non articular distal tuft fracture in the distal phalanx of the left fifth finger, with minimal displacement as described. 2. Markedly comminuted intra-articular middle phalanx fracture in the left fourth finger as described. Electronically Signed   By: Ilona Sorrel M.D.   On: 09/10/2015 13:59    Pertinent items are noted in HPI. Temp:  [98 F (36.7 C)] 98 F (36.7 C) (12/03 1250) Pulse Rate:  [78-79] 79 (12/03 1433) Resp:  [18-21] 18 (12/03 1433) BP: (129-136)/(80-85) 129/80 mmHg (12/03 1433) SpO2:  [95 %-97 %] 97 % (12/03 1433) Weight:  [118.842 kg (262 lb)] 118.842 kg  (262 lb) (12/03 1250) General appearance: alert and cooperative Resp: clear to auscultation bilaterally Cardio: regular rate and rhythm GI: soft, non-tender; bowel sounds normal; no masses,  no organomegaly Extremities: extremities normal, atraumatic, no cyanosis or edema Except for LSF:  Abrasion type laceration dorsally at middle/distal phalanx with some skin loss; LRF with more complex dorsolateral laceration over middle phalanx   Assessment: Open fractures, complex lacerations of Lsf, Lrf Plan: ORIF, repair injured structures as needed I have discussed this treatment plan in detail with patient and family, including the risks of the recommended treatment or surgery, the benefits and the alternatives.  The patient and aregiver understands that additional treatment may be necessary.  [name] [name] CHRISTOPHER 09/10/2015, 3:39 PM

## 2015-09-10 NOTE — Transfer of Care (Signed)
Immediate Anesthesia Transfer of Care Note  Patient: [name] [name]  Procedure(s) Performed: Procedure(s): REPAIR LACERATIONS OF HAND (Left)  Patient Location: PACU  Anesthesia Type:General  Level of Consciousness: awake  Airway & Oxygen Therapy: Patient Spontanous Breathing and Patient connected to face mask oxygen  Post-op Assessment: Report given to RN and Post -op Vital signs reviewed and stable  Post vital signs: Reviewed and stable  Last Vitals:  Filed Vitals:   09/10/15 1300 09/10/15 1433  BP: 136/85 129/80  Pulse: 78 79  Temp:    Resp: 21 18    Complications: No apparent anesthesia complications

## 2015-09-10 NOTE — Anesthesia Procedure Notes (Signed)
Procedure Name: LMA Insertion Date/Time: 09/10/2015 4:06 PM Performed by: Brien MatesMAHONY, Cedric Denison D Pre-anesthesia Checklist: Patient identified, Emergency Drugs available, Suction available, Patient being monitored and Timeout performed Patient Re-evaluated:Patient Re-evaluated prior to inductionOxygen Delivery Method: Circle system utilized Preoxygenation: Pre-oxygenation with 100% oxygen Intubation Type: IV induction Ventilation: Mask ventilation without difficulty LMA: LMA inserted LMA Size: 4.0 Tube type: Oral Number of attempts: 1 Placement Confirmation: positive ETCO2 and breath sounds checked- equal and bilateral Tube secured with: Tape Dental Injury: Teeth and Oropharynx as per pre-operative assessment

## 2015-09-10 NOTE — ED Notes (Signed)
Pt. Stated, i cut my 2 fingers on a table saw.

## 2015-09-10 NOTE — ED Provider Notes (Signed)
CSN: [id number]     Arrival date & time 09/10/15  1227 History   First MD Initiated Contact with Patient 09/10/15 1306     Chief Complaint  Patient presents with  . Hand Injury  . Extremity Laceration     (Consider location/radiation/quality/duration/timing/severity/associated sxs/prior Treatment) HPI   Patient is a 54 year old male with past medical history of hypertension who presents to the ED with complaint of left hand injury, onset PTA. Patient states he was using a table saw when the saw caught a knot in the wood resulting in his left hand going through the table saw. He reports initial bleeding at onset but notes the bleeding is now controlled. Endorses constant throbbing pain to his left 4th and 5th digits. Denies numbness, tingling. Tetanus not UTD.  Past Medical History  Diagnosis Date  . Hypertension   . Back pain    Past Surgical History  Procedure Laterality Date  . Back surgery     Family History  Problem Relation Age of Onset  . Hypertension Mother   . Diabetes Mellitus II Father   . CAD Father    Social History  Substance Use Topics  . Smoking status: Never Smoker   . Smokeless tobacco: None  . Alcohol Use: No    Review of Systems  Constitutional: Negative for fever.  Gastrointestinal: Negative for nausea, vomiting and abdominal pain.  Musculoskeletal: Negative for joint swelling.  Skin: Positive for wound.  Neurological: Negative for weakness and numbness.      Allergies  Morphine and related and Tamiflu  Home Medications   Prior to Admission medications   Medication Sig Start Date End Date Taking? Authorizing Provider  albuterol (PROVENTIL HFA;VENTOLIN HFA) 108 (90 BASE) MCG/ACT inhaler Inhale 2 puffs into the lungs every 6 (six) hours as needed for wheezing or shortness of breath. Use 2 puffs 3 times daily x 4 days, then use every 6 hours as needed. 02/28/15  Yes Rodolph Bong, MD  FLUoxetine (PROZAC) 20 MG capsule Take 20 mg by mouth  daily.   Yes Historical Provider, MD  lisinopril (PRINIVIL,ZESTRIL) 10 MG tablet Take 10 mg by mouth daily.   Yes Historical Provider, MD  LORazepam (ATIVAN) 0.5 MG tablet Take 0.5 mg by mouth every 4 (four) hours as needed for anxiety.   Yes Historical Provider, MD  simvastatin (ZOCOR) 20 MG tablet Take 20 mg by mouth daily at 6 PM.   Yes Historical Provider, MD  acetaminophen (TYLENOL) 500 MG tablet Take 500 mg by mouth every 6 (six) hours as needed for fever.    Historical Provider, MD   BP 129/80 mmHg  Pulse 79  Temp(Src) 98 F (36.7 C) (Oral)  Resp 18  Ht 5' 8.5" (1.74 m)  Wt 118.842 kg  BMI 39.25 kg/m2  SpO2 97% Physical Exam  Constitutional: He is oriented to person, place, and time. He appears well-developed and well-nourished.  HENT:  Head: Normocephalic and atraumatic.  Eyes: Conjunctivae and EOM are normal. Right eye exhibits no discharge. Left eye exhibits no discharge. No scleral icterus.  Neck: Normal range of motion. Neck supple.  Cardiovascular: Normal rate.   Pulmonary/Chest: Effort normal. No respiratory distress.  Musculoskeletal: He exhibits no edema.       Hands: Multiple lacerations noted to left 4th and 5th digits, reference pictures in chart. Sensation intact to Left digits 1-5, hand and wrist. 2+ radial pulses. Cap refill <2 in digits 1-4, unable to assess 5th digits due to laceration present. Decreased ROM  of left 4th and 5th digits at PIP and DIP joint. Decreased strength at 5th DIP and 4th DIP and PIP.   Neurological: He is alert and oriented to person, place, and time.  Skin: Skin is warm and dry.  Nursing note and vitals reviewed.   ED Course  Procedures (including critical care time) Labs Review Labs Reviewed  BASIC METABOLIC PANEL - Abnormal; Notable for the following:    Sodium 134 (*)    Glucose, Bld 115 (*)    All other components within normal limits  CBC WITH DIFFERENTIAL/PLATELET    Imaging Review Dg Hand Complete Left  09/10/2015   CLINICAL DATA:  Saw injury to the left hand EXAM: LEFT HAND - COMPLETE 3+ VIEW COMPARISON:  None. FINDINGS: There is a nonarticular fracture of the distal tuft of the distal phalanx in the left fifth finger, with 2 mm volar displacement of the distal fracture fragment. There is a markedly comminuted middle phalanx fracture in the left fourth finger extending to the distal articular surface, with mild 3 mm ulnar/ dorsal displacement of the dominant distal butterfly fragment. No additional fracture. No dislocation. No suspicious focal osseous lesion. No radiopaque foreign body. IMPRESSION: 1. Non articular distal tuft fracture in the distal phalanx of the left fifth finger, with minimal displacement as described. 2. Markedly comminuted intra-articular middle phalanx fracture in the left fourth finger as described. Electronically Signed   By: Delbert PhenixJason A Poff M.D.   On: 09/10/2015 13:59   I have personally reviewed and evaluated these images and lab results as part of my medical decision-making.  Filed Vitals:   09/10/15 1300 09/10/15 1433  BP: 136/85 129/80  Pulse: 78 79  Temp:    Resp: 21 18     MDM   Final diagnoses:  Open finger fracture, initial encounter    Patient presents with multiple lacerations to left fourth and fifth digits after having his hand cut by a table saw. Patient denies any anticoagulants. Endorses pain to left fourth and fifth digits. VSS. Exam revealed lacerations to dorsal aspect of fourth and fifth digits. Sensation intact to all of left digits, decreased range of motion and strength of fourth and fifth DIP and PIP joints. 2+ radial pulses. Patient given T gap in the ED. X-ray revealed non articular distal tuft fracture in the distal phalanx of the left fifth finger, with minimal displacement and comminuted intra-articular middle phalanx fracture in the left fourth finger. Consult at hand surgery, Dr. Izora Ribasoley will plan to perform surgical repair of lacerations and fractures today.  Orders placed for CBC and BMP, consent filled out in the ED. discussed results and plan for surgery with patient.        Satira Sarkicole Elizabeth La Coma HeightsNadeau, [place] JerseyPA-C 09/10/15 1529  Courteney Randall AnLyn Mackuen, MD 09/11/15 [phone]

## 2015-09-11 NOTE — Op Note (Signed)
NAMEGAYLAND, [name] [name]                 ACCOUNT NO.:  [id number]  MEDICAL RECORD NO.:  [id number]  LOCATION:  [facility]                         FACILITY:  [facility]  PHYSICIAN:  [name] Abraham, MD    DATE OF BIRTH:  1960/12/10  DATE OF PROCEDURE:  09/10/2015 DATE OF DISCHARGE:  09/10/2015                              OPERATIVE REPORT   PREOPERATIVE DIAGNOSIS:  Complex laceration open fractures of the left small and left ring fingers, status post a saw injury.  POSTOPERATIVE DIAGNOSIS:  Complex laceration open fractures of the left small and left ring fingers, status post a saw injury.  PROCEDURE: 1. Exploration of wounds x2 in one of the left small finger, one of     the left ring finger. 2. Debridement of full-thickness skin and subcutaneous tissue, nail     bed, partial removal of nail plate of the left small finger. 3. Repair of the nail bed, left small finger. 4. Repair of associated lacerations of the left small finger, totaling     2 cm. 5. Debridement of full-thickness skin and subcutaneous tissue extensor     tendon and bone of the left ring finger. 6. Open reduction and internal fixation of articular fracture of the     left ring finger middle phalanx with two 0.035 K-wires. 7. Repair of the extensor tendon of the left ring finger. 8. Complex wound closure with local advancement skin flap of the left     ring finger, totaling approximately 5 cm.  INDICATIONS:  Mr. [name] [name] is a 54 year old gentleman, who lacerated his fingers is afternoon with some kind of saw, presented to the emergency room with these injuries.  Risks, benefits, and alternatives of surgery were discussed with he and his family.  They agreed to these risks and agreed to proceed with surgery.  Consent was obtained.  PROCEDURE IN DETAIL:  The patient was taken to the operating room, placed supine on the operating table.  Time-out was performed.  The left upper extremity was prepped and draped in normal sterile  fashion.  The arm was exsanguinated.  Tourniquet was used and inflated to 250 mmHg. The small finger was addressed first.  It was thoroughly irrigated. Full-thickness skin and subcutaneous tissue.  Nail bed and partial nail plate were removed.  There was a defect in the distal nail bed that was debrided and several 6-0 chromic sutures were used to approximate this. There was associated lacerations in the skin, both proximally and distally over the finger, totaling approximately 2 cm, which was reached and closed with interrupted 5-0 nylon sutures.  Next the ring finger, the more serious of the injuries was evaluated.  The wound was explored. The wound was dorsal and ulnar, extending from approximately the proximal PIP joint to the volar aspect of the DIP joint.  There was obvious open fracture with extensor tendon laceration.  There was a good portion of the middle phalanx that was missing.  The fracture was intraarticular with a too small condylar fragments that were severely displaced.  Thorough irrigation of this wound was then performed, full- thickness skin and subcutaneous tissue, extensor tendon, and bone were debrided.  Next the middle  phalanx distal condyle was approximated with towel clamp and the 0.035 K-wire was driven, tangentially across to try and approximate the cartilaginous surface.  This was difficult due to the small nature of each of these fragments and again there was missing bone.  Next, a K-wire was driven in a retrograde fashion from the tip of the distal phalanx through the fracture site across the PIP joint, holding the construct in a somewhat stable configuration.  Next, the extensor tendon was reapproximated as best I could.  There were fraying of the extensor tendon and some tendon loss on the ulnar aspect; however, there was a strip of approximately 3 mm of extensor tendon on the radial aspect of finger that was still in good repair.  Following this, the  soft tissues were freed up around the ulnar aspect and dorsally to laterally near tension free closure.  There was some skin loss centrally and the skin had to be advanced for adequate closure. This was performed with 2 layers; one of 4-0 Vicryl on subcutaneous layers, followed by a skin closure of interrupted 5-0 nylon. Afterwards, a Xeroform as well as a sterile dressing was applied.  The patient tolerated procedure well and was taken recovery in stable condition.     [name] AbrahamHarrill C Bernis Stecher, MD     HCC/MEDQ  D:  09/10/2015  T:  09/11/2015  Job:  [id number]

## 2015-09-12 ENCOUNTER — Encounter (HOSPITAL_COMMUNITY): Payer: Self-pay | Admitting: General Surgery

## 2017-08-01 IMAGING — DX DG HAND COMPLETE 3+V*L*
3 series · 3 of 3 positions shown · non-contrast
Comparison: None.

CLINICAL DATA: Saw injury to the left hand

EXAM:
LEFT HAND - COMPLETE 3+ VIEW

[x hand pa left]
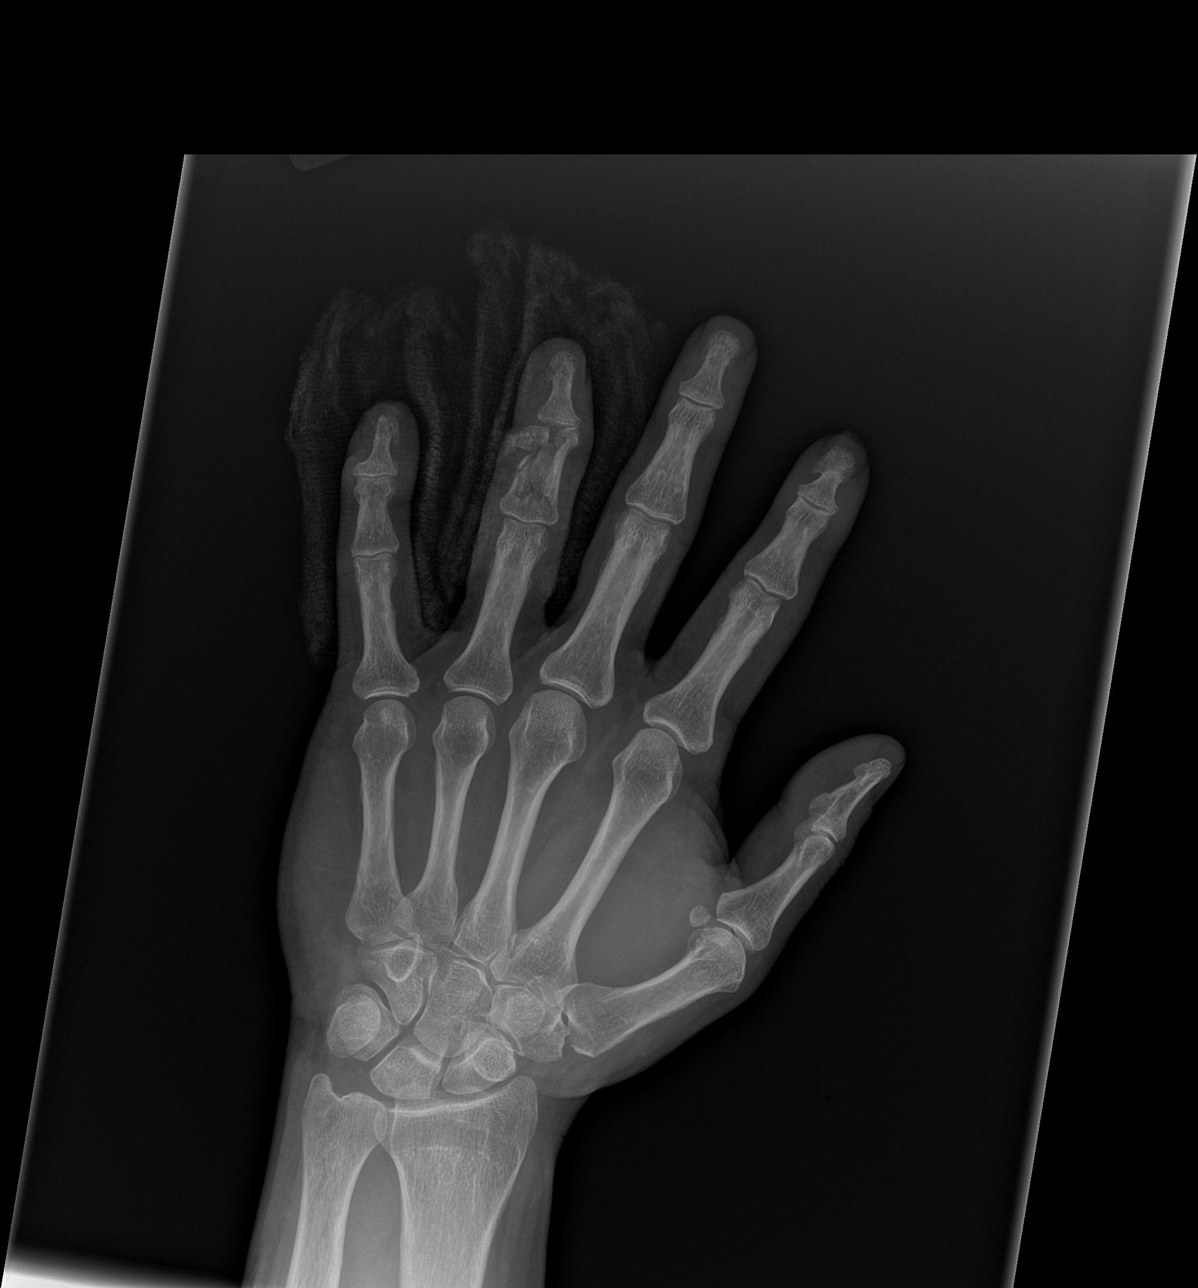

[x hand obl left]
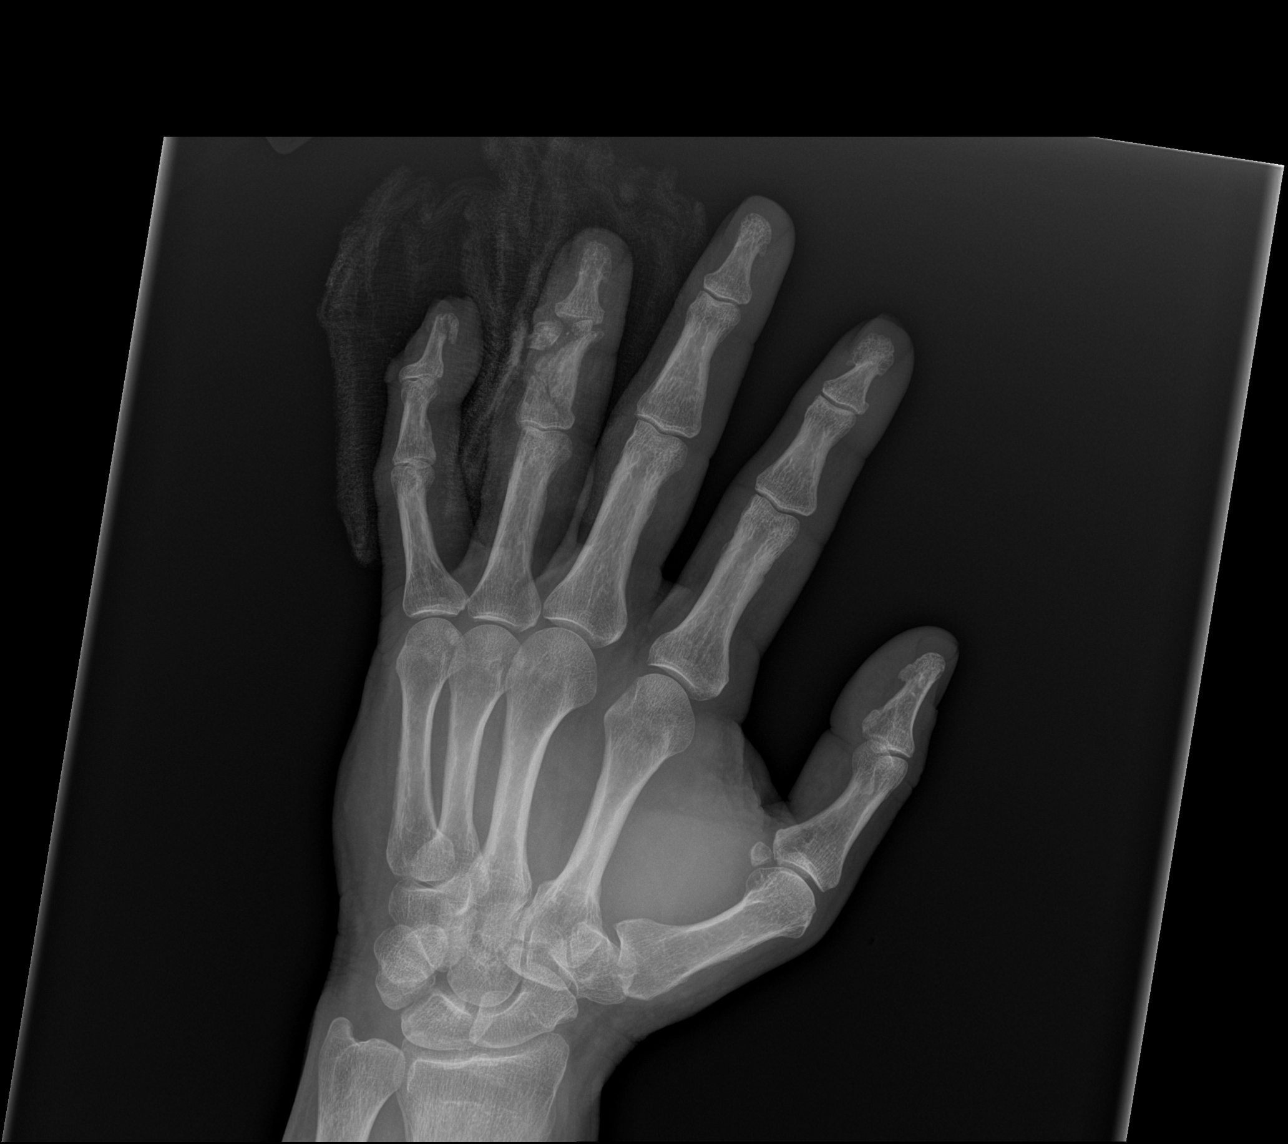

[x hand lat left]
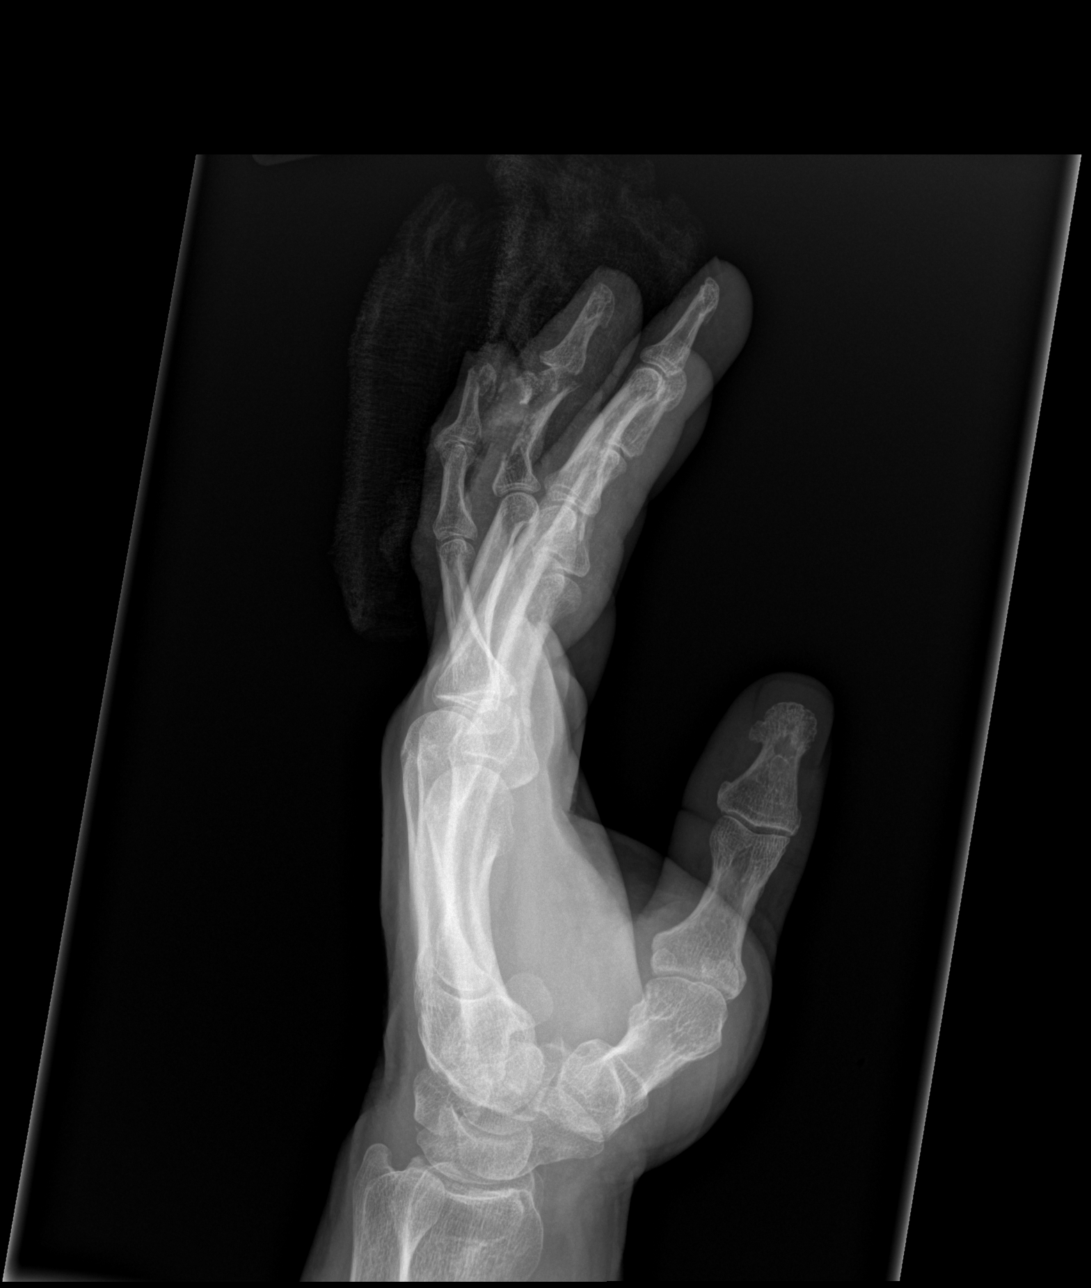

[3 of 3 positions shown; findings below may reference images not displayed]

FINDINGS: There is a nonarticular fracture of the distal tuft of the distal
phalanx in the left fifth finger, with 2 mm volar displacement of
the distal fracture fragment. There is a markedly comminuted middle
phalanx fracture in the left fourth finger extending to the distal
articular surface, with mild 3 mm ulnar/ dorsal displacement of the
dominant distal butterfly fragment. No additional fracture. No
dislocation. No suspicious focal osseous lesion. No radiopaque
foreign body.
IMPRESSION: 1. Non articular distal tuft fracture in the distal phalanx of the
left fifth finger, with minimal displacement as described.
2. Markedly comminuted intra-articular middle phalanx fracture in
the left fourth finger as described.
